# Patient Record
Sex: Male | Born: 2005 | Race: White | Hispanic: Yes | Marital: Single | Smoking: Never smoker
Health system: Southern US, Community
[De-identification: ages and names within clinical notes are randomized; demographics above are authoritative.]

## PROBLEM LIST (undated history)

## (undated) HISTORY — PX: EXTRACORPOREAL CIRCULATION: SHX266

---
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
## 2023-01-01 DIAGNOSIS — F329 Major depressive disorder, single episode, unspecified: Secondary | ICD-10-CM | POA: Diagnosis not present

## 2023-01-08 DIAGNOSIS — Z419 Encounter for procedure for purposes other than remedying health state, unspecified: Secondary | ICD-10-CM | POA: Diagnosis not present

## 2023-01-22 ENCOUNTER — Encounter: Payer: Self-pay | Admitting: Internal Medicine

## 2023-01-22 ENCOUNTER — Ambulatory Visit (INDEPENDENT_AMBULATORY_CARE_PROVIDER_SITE_OTHER): Payer: Medicaid Other | Admitting: Internal Medicine

## 2023-01-22 VITALS — BP 102/66 | HR 40 | Ht 67.0 in | Wt 162.1 lb

## 2023-01-22 DIAGNOSIS — M25512 Pain in left shoulder: Secondary | ICD-10-CM

## 2023-01-22 DIAGNOSIS — R748 Abnormal levels of other serum enzymes: Secondary | ICD-10-CM

## 2023-01-22 DIAGNOSIS — G8929 Other chronic pain: Secondary | ICD-10-CM | POA: Diagnosis not present

## 2023-01-22 DIAGNOSIS — E7800 Pure hypercholesterolemia, unspecified: Secondary | ICD-10-CM

## 2023-01-22 NOTE — Assessment & Plan Note (Addendum)
Chronic problem, uncontrolled Previous workup reviewed by orthopedic surgeon as mentioned in HPI [facility]erral [facility] to [facility] in [place]. Patient will be attending [employer], this will be a good location for patient.

## 2023-01-22 NOTE — Progress Notes (Signed)
     HPI:[name] is a 17 y.o. male who presents  to establish care and has concern for chronic left shoulder pain. The left posterior shoulder pain started 2 years ago and he first noticed it when performing warm up stretches. The pain has been intermittent for 2 years and also has pain in his triceps. He notices weakness in left triceps. The pain in triceps is a inch above elbow. He has some associated numbness in his left 5th finger. Also some weakness in thenar muscle. He was seen by orthopedic group in February. He had normal shoulder xray . He was referred to physical therapy but never went. His stepmother never told him about the referral. He is with his mother today who has concerns this is ongoing injury and patient is not improved.    History reviewed. No pertinent past medical history.  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    Family History  Problem Relation Age of Onset   Hypertension Mother    Hyperlipidemia Father     Social History   Tobacco Use   Smoking status: Never   Smokeless tobacco: Never  Substance Use Topics   Alcohol use: Never   Drug use: Never     Physical Exam: Vitals:   01/22/23 0819  BP: 102/66  Pulse: (!) 40  SpO2: 96%  Weight: 162 lb 1.9 oz (73.5 kg)  Height: 5\' 7"  (1.702 m)     Physical Exam Constitutional:      Appearance: He is well-developed and well-groomed.  Eyes:     General: No scleral icterus.    Conjunctiva/sclera: Conjunctivae normal.  Cardiovascular:     Rate and Rhythm: Normal rate and regular rhythm.     Heart sounds: No murmur heard.    No friction rub. No gallop.  Pulmonary:     Effort: Pulmonary effort is normal.     Breath sounds: No wheezing, rhonchi or rales.  Musculoskeletal:     Right lower leg: No edema.     Left lower leg: No edema.     Comments: Normal scapula retraction, normal muscle tone. Left Shoulder:No obvious deformity or  asymmetry. No bruising. No swelling No TTP. Full ROM . Normal strength IR and ER. Normal strength and no pain with empty can testing. No pain at lateral or medial left elbow epicondyles. Mild left thenar atrophy on inspection compared to right. No significant weakness on testing.  No decreased sensation in left hand compared to right.      Skin:    General: Skin is warm and dry.      Assessment & Plan:   Chronic left shoulder pain Chronic problem, uncontrolled Previous workup reviewed by orthopedic surgeon as mentioned in HPI Referral placed to [facility] in [place]. Patient will be attending [employer], this will be a good location for patient.     Pure hypercholesterolemia Review of prior labs from 2/202020 showed elevated cholesterol. Patient is normal weight. Exercises regularly. Fathers side of family has HLD.  Check lipid panel today  Elevated alkaline phosphatase level Elevated Alk phos and total bili on review of prior labs from 06/29/2019 Will check CMP today    Milus Banister, MD

## 2023-01-22 NOTE — Assessment & Plan Note (Signed)
Elevated Alk phos and total bili on review of prior labs from 06/29/2019 Will check CMP today

## 2023-01-22 NOTE — Assessment & Plan Note (Addendum)
Review of prior labs from 2/202020 showed elevated cholesterol. Patient is normal weight. Exercises regularly. Fathers side of family has HLD.  Check lipid panel today

## 2023-01-22 NOTE — Patient Instructions (Signed)
Thank you, [name] for allowing [place] to provide your care today.   I have ordered the following labs for you:   Lab Orders         CMP14+EGFR         Lipid panel       Referrals ordered today:    Referral Orders         Ambulatory referral to Sports Medicine       Reminders: Follow up in 6 months     Thurmon Fair, M.D.

## 2023-01-23 LAB — CMP14+EGFR
ALT: 15 IU/L (ref 0–44)
AST: 22 IU/L (ref 0–40)
Albumin: 4.9 g/dL (ref 4.3–5.2)
Alkaline Phosphatase: 83 IU/L (ref 51–125)
BUN/Creatinine Ratio: 17 (ref 9–20)
BUN: 16 mg/dL (ref 6–20)
Bilirubin Total: 0.7 mg/dL (ref 0.0–1.2)
CO2: 25 mmol/L (ref 20–29)
Calcium: 9.8 mg/dL (ref 8.7–10.2)
Chloride: 98 mmol/L (ref 96–106)
Creatinine, Ser: 0.95 mg/dL (ref 0.76–1.27)
Globulin, Total: 2.4 g/dL (ref 1.5–4.5)
Glucose: 87 mg/dL (ref 70–99)
Potassium: 4.9 mmol/L (ref 3.5–5.2)
Sodium: 139 mmol/L (ref 134–144)
Total Protein: 7.3 g/dL (ref 6.0–8.5)
eGFR: 119 mL/min/{1.73_m2} (ref >59)

## 2023-01-23 LAB — LIPID PANEL
Chol/HDL Ratio: 5.5 ratio — ABNORMAL HIGH (ref 0.0–5.0)
Cholesterol, Total: 208 mg/dL — ABNORMAL HIGH (ref 100–169)
HDL: 38 mg/dL — ABNORMAL LOW (ref >39)
LDL Chol Calc (NIH): 142 mg/dL — ABNORMAL HIGH (ref 0–109)
Triglycerides: 152 mg/dL — ABNORMAL HIGH (ref 0–89)
VLDL Cholesterol Cal: 28 mg/dL (ref 5–40)

## 2023-02-01 ENCOUNTER — Encounter: Payer: Self-pay | Admitting: Internal Medicine

## 2023-02-07 ENCOUNTER — Encounter: Payer: Self-pay | Admitting: Sports Medicine

## 2023-02-07 ENCOUNTER — Ambulatory Visit (INDEPENDENT_AMBULATORY_CARE_PROVIDER_SITE_OTHER): Payer: Medicaid Other | Admitting: Sports Medicine

## 2023-02-07 VITALS — BP 108/68 | HR 42 | Ht 67.0 in | Wt 162.0 lb

## 2023-02-07 DIAGNOSIS — M5412 Radiculopathy, cervical region: Secondary | ICD-10-CM | POA: Diagnosis not present

## 2023-02-07 DIAGNOSIS — G5622 Lesion of ulnar nerve, left upper limb: Secondary | ICD-10-CM

## 2023-02-07 MED ORDER — MELOXICAM 15 MG PO TABS: 15.0000 mg | ORAL_TABLET | Freq: Every day | ORAL | 0 refills | Status: DC

## 2023-02-07 NOTE — Assessment & Plan Note (Signed)
-   Daniel Wilcox's neck exam is largely unremarkable except for some tightness of the scalene muscles left greater than right.  His symptoms are not reproducible on exam, however it is possible his left shoulder and elbow pain have some cervical involvement.  This may be due to muscular impingement. - Ultrasound of the shoulder was unremarkable for pathology - We will start physical therapy and see him back in 6 weeks.

## 2023-02-07 NOTE — Progress Notes (Signed)
Daniel Wilcox - 17 y.o. male MRN [id number]  Date of birth: 05-09-2006  PCP: Billie Lade, MD  Subjective:  No chief complaint on file. Left shoulder and elbow pain  HPI: Past Medical, Surgical, Social, and Family History Reviewed & Updated per EMR.   Patient is a 17 y.o. male here for left shoulder and elbow pain that started a year and a half ago. It is exacerbated by use of the elbow. It is alleviated by rest.  He does recall straining his left trapezius for which she was seen at the doctor's office and was told to rest.  He was also seen at his PCP and diagnosed with cubital tunnel syndrome but has not started physical therapy at this point.  He is not taking oral medications because the pain does not bother him enough.  He is not been dropping any items.  He has not had any neck pain or noticed the pain radiating from his neck down to his left upper extremity.  History reviewed. No pertinent past medical history.  Current Outpatient Medications on File Prior to Visit  Medication Sig Dispense Refill   Multiple Vitamin (MULTIVITAMIN WITH MINERALS) TABS tablet Take 1 tablet by mouth daily.     No current facility-administered medications on file prior to visit.    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    Not on File      Objective:  Physical Exam: VS: BP:108/68  HR:(!) 42bpm  TEMP: ( )  RESP:97 %  HT:5\' 7"  (170.2 cm)   WT:162 lb (73.5 kg)  BMI:25.37  Gen: NAD, comfortable in exam room EXAM; NEURO GAIT: Normal MSK: Neck with full range of motion, some tightness of the scalene muscles left greater than right, no atrophy noted in the upper extremities or back muscles. Respiratory: normal work of breathing on room air Skin: No rashes, abrasions, or ecchymosis Left Shoulder: Inspection reveals no abnormalities, atrophy or asymmetry. non TTP over sternoclavicular joint, no deformity of the clavicle  non TTP over  [facility] joint  No deformity, ROM: Flexion 180, Extension 40, Internal Rotation 90, External Rotation 90, Abduction 180, Adduction  , Strength: 5/5 , No sensory deficits, and DTRs: biceps 1+, triceps 1+, wrist 1+, patella not done, achilles not done AC joint: Cross arm test neg, [employer] test neg, Subacromial impingement: Hawkins neg, Neers neg, and Biceps: Speeds neg, Yergasons not performed Full/empty can neg Sulcus neg, Load and Shift neg, and O'Brien's neg Normal scapular function observed.  Left elbow Ecchymosis or edema: none No gross bony abnormality ROM: full flexion, full extension, full pronation, full supination, no pain elicited from movements Strength: Flexion 5/5, Extension 5/5, Supination 5/5, Pronation 5/5, Grip 5/5 no Pain elicited w/ resisted motions ECRB tenderness: neg Medial epicondyle: tender to palpation of overlying soft tissue Lateral epicondyle: NT w/ resisted wrist extension Valgus stress: stable Tinel's positive No sensory Deficits  Ultrasound:  Ultrasound of Left Shoulder:  Biceps Tendon short -in tact w/ no hypoechogenicity surrounding tendon Biceps Tendon long -in tact as above, no disorganization of fibers Supraspinatus tendon -in tact, normal insertion, no underlying hypoechoic pockets  Subscapularis tendon -normal w/out hypoechogenicity Infraspinatus tendon -normal w/out hypoechogenicity Teres Minor tendon -normal w/out hypoechogenicity AC joint - smooth joint lines, no geiser sign  Summary and Additional findings - ultrasound of the shoulder is normal without any pathology  Ultrasound and interpretation by Dr. Webb Silversmith and Dr. Margaretha Sheffield  Assessment & Plan:   Ulnar neuritis, left - Ultrasound shows some inflammation around the ulnar nerve at the cubital tunnel as well as positive Tinel's test. - She is not having any true weakness on exam. - We will start physical therapy, Mobic 15 mg p.o. daily x 1 week - Follow-up in 6 weeks.  He was educated  to return earlier if experiencing true weakness of the left distal upper extremity  Cervical radicular pain - Mccabe's neck exam is largely unremarkable except for some tightness of the scalene muscles left greater than right.  His symptoms are not reproducible on exam, however it is possible his left shoulder and elbow pain have some cervical involvement.  This may be due to muscular impingement. - Ultrasound of the shoulder was unremarkable for pathology - We will start physical therapy and see him back in 6 weeks.    Rica Mote MD [facility] Sports Medicine Fellow   Patient seen and evaluated with the [facility] fellow and I agree with the above plan of care.

## 2023-02-07 NOTE — Assessment & Plan Note (Signed)
-   Ultrasound shows some inflammation around the ulnar nerve at the cubital tunnel as well as positive Tinel's test. - She is not having any true weakness on exam. - We will start physical therapy, Mobic 15 mg p.o. daily x 1 week - Follow-up in 6 weeks.  He was educated to return earlier if experiencing true weakness of the left distal upper extremity

## 2023-02-08 DIAGNOSIS — Z419 Encounter for procedure for purposes other than remedying health state, unspecified: Secondary | ICD-10-CM | POA: Diagnosis not present

## 2023-02-26 DIAGNOSIS — F329 Major depressive disorder, single episode, unspecified: Secondary | ICD-10-CM | POA: Diagnosis not present

## 2023-03-05 DIAGNOSIS — F329 Major depressive disorder, single episode, unspecified: Secondary | ICD-10-CM | POA: Diagnosis not present

## 2023-03-06 ENCOUNTER — Other Ambulatory Visit: Payer: Self-pay

## 2023-03-06 MED ORDER — MELOXICAM 15 MG PO TABS: 15.0000 mg | ORAL_TABLET | Freq: Every day | ORAL | 0 refills | Status: AC

## 2023-03-06 NOTE — Progress Notes (Signed)
Pt mom called stating pt never picked up medication, please change pharmacy and resend to [facility] pharmacy

## 2023-03-11 DIAGNOSIS — Z419 Encounter for procedure for purposes other than remedying health state, unspecified: Secondary | ICD-10-CM | POA: Diagnosis not present

## 2023-03-21 ENCOUNTER — Ambulatory Visit: Payer: Medicaid Other | Admitting: Family Medicine

## 2023-04-10 DIAGNOSIS — Z419 Encounter for procedure for purposes other than remedying health state, unspecified: Secondary | ICD-10-CM | POA: Diagnosis not present

## 2023-04-16 DIAGNOSIS — F329 Major depressive disorder, single episode, unspecified: Secondary | ICD-10-CM | POA: Diagnosis not present

## 2023-05-07 DIAGNOSIS — F329 Major depressive disorder, single episode, unspecified: Secondary | ICD-10-CM | POA: Diagnosis not present

## 2023-05-09 ENCOUNTER — Ambulatory Visit: Payer: Medicaid Other | Attending: Sports Medicine | Admitting: Physical Therapy

## 2023-05-09 ENCOUNTER — Encounter: Payer: Self-pay | Admitting: Physical Therapy

## 2023-05-09 DIAGNOSIS — M5412 Radiculopathy, cervical region: Secondary | ICD-10-CM | POA: Diagnosis not present

## 2023-05-09 DIAGNOSIS — M6281 Muscle weakness (generalized): Secondary | ICD-10-CM

## 2023-05-09 DIAGNOSIS — M542 Cervicalgia: Secondary | ICD-10-CM | POA: Diagnosis not present

## 2023-05-09 NOTE — Therapy (Signed)
OUTPATIENT PHYSICAL THERAPY CERVICAL EVALUATION   Patient Name: Daniel Wilcox MRN: [id number] DOB:[birth year], 17 y.o., male Today's Date: 05/09/2023  END OF SESSION:  PT End of Session - 05/09/23 1558     Visit Number 1    Number of Visits 12    Date for PT Re-Evaluation 06/20/23    Authorization Type [place] [employer]    PT Start Time 1423    PT Stop Time 1520    PT Time Calculation (min) 57 min    Activity Tolerance Patient tolerated treatment well    Behavior During Therapy [facility] for tasks assessed/performed             History reviewed. No pertinent past medical history. 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   Cervical radicular pain 02/07/2023   Chronic left shoulder pain 01/22/2023   Pure hypercholesterolemia 01/22/2023   Elevated alkaline phosphatase level 01/22/2023    PCP: Christel Mormon MD   REFERRING PROVIDER: Ralene Cork, DO  REFERRING DIAG: [phone] (ICD-10-CM) - Cervical radiculopathy  THERAPY DIAG:  Cervicalgia  Muscle weakness (generalized)  Rationale for Evaluation and Treatment: Rehabilitation  ONSET DATE: 18-24 mos  SUBJECTIVE:                                                                                                                                                                                                         SUBJECTIVE STATEMENT: I don't know how I did it but could be due to weight lifting, 18 mos.  He recalls pain onset as gradual but it is now constant.  Pain is in post L neck and L scapular (rear delt).  Tricep "feels weird".  Was told he may have cubital tunnel syndrome. He endorses weakness with lifting L arm , L grip. He says his L post shoulder is hard to activate. Some stiffness in neck when turning head, looking down.  Denies radiating pain.  The pain limits him with  using UEs for home tasks, lifting.  It just feels not right.  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  He can do these things but not with confidence, has some pain afterwards.   Hand dominance: Right  PERTINENT HISTORY:  None relevant   PAIN:  Are you having pain? Yes: NPRS scale: 5/10 Pain location: L neck and L post arm Pain description: aching, heavy Aggravating factors: using arm  Relieving  factors: massage chair, heat   PRECAUTIONS: None  RED FLAGS: None     WEIGHT BEARING RESTRICTIONS: No  FALLS:  Has patient fallen in last 6 months? No  LIVING ENVIRONMENT: Lives with: lives with their family and on campus Lives in: Other on campus Stairs:  NA Has following equipment at home: None  OCCUPATION: Consulting civil engineer, Exploratory Major [employer]   PLOF: Independent  PATIENT GOALS: Pt wants to get in the best shape of my life    OBJECTIVE:  Note: Objective measures were completed at Evaluation unless otherwise noted.  DIAGNOSTIC FINDINGS:  Reports XR (shoulder) in the past which was normal  PATIENT SURVEYS:  FOTO 54%  COGNITION: Overall cognitive status: Within functional limits for tasks assessed  SENSATION: WFL  POSTURE: forward head  PALPATION: TTP along L posterior shoulder, periscapular area and lower cervical spine  Hypermobility noted in C spine with P/A pressure, see AROM    CERVICAL ROM:   Active ROM A/PROM (deg) eval  Flexion 70 min Pain L   Extension 70 deg   Right lateral flexion 50 min pain on L   Left lateral flexion 42 min on pain R  Right rotation WNL pain on L   Left rotation WNL pain on L    (Blank rows = not tested)  UPPER EXTREMITY ROM:  Active ROM Right eval Left eval  Shoulder flexion  170  Shoulder extension    Shoulder abduction  170  Shoulder adduction    Shoulder extension    Shoulder internal rotation  *Full FR   Shoulder external rotation  *Full FR  Elbow flexion    Elbow extension    Wrist flexion     Wrist extension    Wrist ulnar deviation    Wrist radial deviation    Wrist pronation    Wrist supination     (Blank rows = not tested)  *Fingertips meet to touch midback   UPPER EXTREMITY MMT:  MMT Right eval Left eval  Shoulder flexion 5 4+  Shoulder extension    Shoulder abduction 5 4+  Shoulder adduction    Shoulder extension 5 4  Shoulder internal rotation    Shoulder external rotation    Middle trapezius 4+ 4  Lower trapezius    Elbow flexion 5 5  Elbow extension 5 4  Wrist flexion    Wrist extension    Wrist ulnar deviation    Wrist radial deviation    Wrist pronation    Wrist supination    Grip strength 75 86   (Blank rows = not tested)  CERVICAL SPECIAL TESTS:  Upper limb tension test (ULTT): neg but with pain, Spurling's test: Positive, and Distraction test: no change in pain but did feel good NEG SPURLING: pain on L side of neck with Rt spurling, none in arm   FUNCTIONAL TESTS:  DNF-: 30 sec with mod difficulty   TODAY'S TREATMENT:  DATE: 05/09/23   PATIENT EDUCATION:  Education details: PT, HEP, deep neck flexor, upper crossed syndrome, trigger points  Person educated: Patient Education method: Explanation, Demonstration, Verbal cues, and Handouts Education comprehension: needs further education  HOME EXERCISE PROGRAM:   Access Code: [email or web address] URL: [email or web address] Date: 05/09/2023 Prepared by: Karie Mainland  Exercises - Supine Head Nod with Deep Neck Flexor Activation  - 1-2 x daily - 7 x weekly - 2 sets - 10 reps - 10 hold - Cervical Retraction with Resistance  - 1-2 x daily - 7 x weekly - 2 sets - 10 reps - 5 hold - Nerve Glides Ulnar    CLINICAL IMPRESSION: Patient is a 17 y.o. male who was seen today for physical therapy evaluation and treatment for cervical radiculopathy.   He does show some  hypermobility in cervical spine, as well as lumbar and shoulders.  Triceps weaker on L but grip is better on L than Rt.  Neg Spurling.  Symptoms are mild and vague but ongoing for > 18 mos.    OBJECTIVE IMPAIRMENTS: decreased strength, increased fascial restrictions, postural dysfunction, and pain.   ACTIVITY LIMITATIONS: carrying and lifting  PARTICIPATION LIMITATIONS: community activity and school  PERSONAL FACTORS: Age, Time since onset of injury/illness/exacerbation, and 1 comorbidity: hypermobile spine   are also affecting patient's functional outcome.   REHAB POTENTIAL: Excellent  CLINICAL DECISION MAKING: Evolving  EVALUATION COMPLEXITY: MOD   GOALS: Goals reviewed with patient? Yes  LONG TERM GOALS: Target date: 06/20/2023    Pt will be I with HEP for cervical and scapular stability, Shoulder strength  Baseline:  Goal status: INITIAL  2.  Pt will be able to demo pain free AROM of cervical spine  Baseline:  Goal status: INITIAL  3.  Pt will perform DNF test to 30 sec with no loss of capital flexion  Baseline: 30 sec with mod difficulty, chin lifts slightly  Goal status: INITIAL  4.  Pt will report resolution of L sided shoulder symptoms  Baseline:  Goal status: INITIAL  5.  Pt will demo 5/5 triceps strength for optimal shoulder function.  Baseline:  Goal status: INITIAL  6.  FOTO score will improve to 70%  Baseline: 54% Goal status: INITIAL   PLAN:  PT FREQUENCY: 2x/week  PT DURATION: 6 weeks  PLANNED INTERVENTIONS: 97164- PT Re-evaluation, 97110-Therapeutic exercises, 97530- Therapeutic activity, 97112- Neuromuscular re-education, 97535- Self Care, 24401- Manual therapy, 97014- Electrical stimulation (unattended), [id number]- Electrical stimulation (manual), Patient/Family education, Taping, Dry Needling, Spinal mobilization, Cryotherapy, and Moist heat  PLAN FOR NEXT SESSION: check HEP/ ulnar nerve glides, manual DN, stabilization (wall, prone )     Vignesh Willert, PT 05/09/2023, 4:00 PM   Karie Mainland, PT 05/09/23 4:00 PM Phone: [phone] Fax: [phone]

## 2023-05-11 DIAGNOSIS — Z419 Encounter for procedure for purposes other than remedying health state, unspecified: Secondary | ICD-10-CM | POA: Diagnosis not present

## 2023-05-18 NOTE — Therapy (Unsigned)
OUTPATIENT PHYSICAL THERAPY TREATMENT NOTE   Patient Name: Daniel Wilcox MRN: [id number] DOB:[birth year], 17 y.o., male Today's Date: 05/21/2023  END OF SESSION:  PT End of Session - 05/21/23 1416     Visit Number 2    Number of Visits 12    Date for PT Re-Evaluation 06/20/23    Authorization Type [place] [employer]    PT Start Time 1415   late for appointment   PT Stop Time 1445    PT Time Calculation (min) 30 min    Activity Tolerance Patient tolerated treatment well    Behavior During Therapy [facility] for tasks assessed/performed              History reviewed. No pertinent past medical history. 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   Cervical radicular pain 02/07/2023   Chronic left shoulder pain 01/22/2023   Pure hypercholesterolemia 01/22/2023   Elevated alkaline phosphatase level 01/22/2023    PCP: Christel Mormon MD   REFERRING PROVIDER: Ralene Cork, DO  REFERRING DIAG: [phone] (ICD-10-CM) - Cervical radiculopathy  THERAPY DIAG:  Cervicalgia  Muscle weakness (generalized)  Rationale for Evaluation and Treatment: Rehabilitation  ONSET DATE: 18-24 mos  SUBJECTIVE:                                                                                                                                                                                                         SUBJECTIVE STATEMENT:  Patient reports improving and localizing symptoms.  No pain reported at start of session.  Still notes some weakness in LUE but is a poor historian regarding symptom description and aggravating/relieving factors.  Hand dominance: Right  PERTINENT HISTORY:  None relevant   PAIN:  Are you having pain? Yes: NPRS scale: 5/10 Pain location: L neck and L post arm Pain description: aching, heavy Aggravating factors: using arm   Relieving factors: massage chair, heat   PRECAUTIONS: None  RED FLAGS: None     WEIGHT BEARING RESTRICTIONS: No  FALLS:  Has patient fallen in last 6 months? No  LIVING ENVIRONMENT: Lives with: lives with their family and on campus Lives in: Other on campus Stairs:  NA Has following equipment at home: None  OCCUPATION: Consulting civil engineer, Exploratory [employer]   PLOF: Independent  PATIENT GOALS: Pt wants to get in the best shape of my life    OBJECTIVE:  Note: Objective measures were completed  at Evaluation unless otherwise noted.  DIAGNOSTIC FINDINGS:  Reports XR (shoulder) in the past which was normal  PATIENT SURVEYS:  FOTO 54%  COGNITION: Overall cognitive status: Within functional limits for tasks assessed  SENSATION: WFL  POSTURE: forward head  PALPATION: TTP along L posterior shoulder, periscapular area and lower cervical spine  Hypermobility noted in C spine with P/A pressure, see AROM    CERVICAL ROM:   Active ROM A/PROM (deg) eval  Flexion 70 min Pain L   Extension 70 deg   Right lateral flexion 50 min pain on L   Left lateral flexion 42 min on pain R  Right rotation WNL pain on L   Left rotation WNL pain on L    (Blank rows = not tested)  UPPER EXTREMITY ROM:  Active ROM Right eval Left eval  Shoulder flexion  170  Shoulder extension    Shoulder abduction  170  Shoulder adduction    Shoulder extension    Shoulder internal rotation  *Full FR   Shoulder external rotation  *Full FR  Elbow flexion    Elbow extension    Wrist flexion    Wrist extension    Wrist ulnar deviation    Wrist radial deviation    Wrist pronation    Wrist supination     (Blank rows = not tested)  *Fingertips meet to touch midback   UPPER EXTREMITY MMT:  MMT Right eval Left eval  Shoulder flexion 5 4+  Shoulder extension    Shoulder abduction 5 4+  Shoulder adduction    Shoulder extension 5 4  Shoulder internal rotation    Shoulder external rotation     Middle trapezius 4+ 4  Lower trapezius    Elbow flexion 5 5  Elbow extension 5 4  Wrist flexion    Wrist extension    Wrist ulnar deviation    Wrist radial deviation    Wrist pronation    Wrist supination    Grip strength 75 86   (Blank rows = not tested)  CERVICAL SPECIAL TESTS:  Upper limb tension test (ULTT): neg but with pain, Spurling's test: Positive, and Distraction test: no change in pain but did feel good NEG SPURLING: pain on L side of neck with Rt spurling, none in arm   FUNCTIONAL TESTS:  DNF-: 30 sec with mod difficulty   TODAY'S TREATMENT:         [facility] Adult PT Treatment:                                                DATE: 05/21/23 Therapeutic Exercise: Nustep L4 6 min Supine chin tucks 10x L UT stretch 30s x2 L levator stretch 30s x2 Prone L shoulder extension 3# 15x Prone L shoulder flexion 3# 15x Prone L hor abd 3# 15x  DATE: 05/09/23   PATIENT EDUCATION:  Education details: PT, HEP, deep neck flexor, upper crossed syndrome, trigger points  Person educated: Patient Education method: Explanation, Demonstration, Verbal cues, and Handouts Education comprehension: needs further education  HOME EXERCISE PROGRAM:   Access Code: [email or web address] URL: [email or web address] Date: 05/09/2023 Prepared by: Karie Mainland  Exercises - Supine Head Nod with Deep Neck Flexor Activation  - 1-2 x daily - 7 x weekly - 2 sets - 10 reps - 10 hold - Cervical Retraction with Resistance  - 1-2 x daily - 7 x weekly - 2 sets - 10 reps - 5 hold - Nerve Glides Ulnar    CLINICAL IMPRESSION: Patient arrived 15 min late for session and treatment abbreviated accordingly.  Incorporated aerobic work, HEP review.  Added stretching and posterior shoulder strengthening tasks.  Able to complete all requested tasks w/o symptom aggravation  OBJECTIVE IMPAIRMENTS:  decreased strength, increased fascial restrictions, postural dysfunction, and pain.   ACTIVITY LIMITATIONS: carrying and lifting  PARTICIPATION LIMITATIONS: community activity and school  PERSONAL FACTORS: Age, Time since onset of injury/illness/exacerbation, and 1 comorbidity: hypermobile spine   are also affecting patient's functional outcome.   REHAB POTENTIAL: Excellent  CLINICAL DECISION MAKING: Evolving  EVALUATION COMPLEXITY: MOD   GOALS: Goals reviewed with patient? Yes  LONG TERM GOALS: Target date: 06/20/2023    Pt will be I with HEP for cervical and scapular stability, Shoulder strength  Baseline:  Goal status: INITIAL  2.  Pt will be able to demo pain free AROM of cervical spine  Baseline:  Goal status: INITIAL  3.  Pt will perform DNF test to 30 sec with no loss of capital flexion  Baseline: 30 sec with mod difficulty, chin lifts slightly  Goal status: INITIAL  4.  Pt will report resolution of L sided shoulder symptoms  Baseline:  Goal status: INITIAL  5.  Pt will demo 5/5 triceps strength for optimal shoulder function.  Baseline:  Goal status: INITIAL  6.  FOTO score will improve to 70%  Baseline: 54% Goal status: INITIAL   PLAN:  PT FREQUENCY: 2x/week  PT DURATION: 6 weeks  PLANNED INTERVENTIONS: 97164- PT Re-evaluation, 97110-Therapeutic exercises, 97530- Therapeutic activity, 97112- Neuromuscular re-education, 97535- Self Care, 78295- Manual therapy, 97014- Electrical stimulation (unattended), [id number]- Electrical stimulation (manual), Patient/Family education, Taping, Dry Needling, Spinal mobilization, Cryotherapy, and Moist heat  PLAN FOR NEXT SESSION: check HEP/ ulnar nerve glides, manual DN, stabilization (wall, prone )    Hildred Laser, PT 05/21/2023, 2:44 PM   Karie Mainland, PT 05/21/23 2:44 PM Phone: [phone] Fax: [phone]

## 2023-05-21 ENCOUNTER — Ambulatory Visit: Payer: Medicaid Other | Attending: Sports Medicine

## 2023-05-21 DIAGNOSIS — M6281 Muscle weakness (generalized): Secondary | ICD-10-CM

## 2023-05-21 DIAGNOSIS — M542 Cervicalgia: Secondary | ICD-10-CM | POA: Diagnosis not present

## 2023-05-23 ENCOUNTER — Ambulatory Visit: Payer: Medicaid Other

## 2023-05-23 DIAGNOSIS — M6281 Muscle weakness (generalized): Secondary | ICD-10-CM | POA: Diagnosis not present

## 2023-05-23 DIAGNOSIS — M542 Cervicalgia: Secondary | ICD-10-CM

## 2023-05-23 NOTE — Therapy (Signed)
OUTPATIENT PHYSICAL THERAPY TREATMENT NOTE   Patient Name: Daniel Wilcox MRN: [id number] DOB:[birth year], 17 y.o., male Today's Date: 05/23/2023  END OF SESSION:  PT End of Session - 05/23/23 1356     Visit Number 3    Number of Visits 12    Date for PT Re-Evaluation 06/20/23    Authorization Type [place] [employer]    Authorization Time Period 10 visits approved 05/21/23-07/19/22    Authorization - Visit Number 2    Authorization - Number of Visits 10    PT Start Time 1400    PT Stop Time 1440    PT Time Calculation (min) 40 min    Activity Tolerance Patient tolerated treatment well    Behavior During Therapy [facility] for tasks assessed/performed               History reviewed. No pertinent past medical history. 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   Cervical radicular pain 02/07/2023   Chronic left shoulder pain 01/22/2023   Pure hypercholesterolemia 01/22/2023   Elevated alkaline phosphatase level 01/22/2023    PCP: Christel Mormon MD   REFERRING PROVIDER: Ralene Cork, DO  REFERRING DIAG: [phone] (ICD-10-CM) - Cervical radiculopathy  THERAPY DIAG:  Cervicalgia  Muscle weakness (generalized)  Rationale for Evaluation and Treatment: Rehabilitation  ONSET DATE: 18-24 mos  SUBJECTIVE:                                                                                                                                                                                                         SUBJECTIVE STATEMENT: Patient reports that his pain has improved since working on the HEP.   Hand dominance: Right  PERTINENT HISTORY:  None relevant   PAIN:  Are you having pain? Yes: NPRS scale: 4/10 Pain location: L neck and L post arm Pain description: aching, heavy Aggravating factors: using arm  Relieving factors: massage  chair, heat   PRECAUTIONS: None  RED FLAGS: None     WEIGHT BEARING RESTRICTIONS: No  FALLS:  Has patient fallen in last 6 months? No  LIVING ENVIRONMENT: Lives with: lives with their family and on campus Lives in: Other on campus Stairs:  NA Has following equipment at home: None  OCCUPATION: Consulting civil engineer, Exploratory Major [employer]   PLOF: Independent  PATIENT GOALS: Pt wants to get in the best shape of my life    OBJECTIVE:  Note:  Objective measures were completed at Evaluation unless otherwise noted.  DIAGNOSTIC FINDINGS:  Reports XR (shoulder) in the past which was normal  PATIENT SURVEYS:  FOTO 54%  COGNITION: Overall cognitive status: Within functional limits for tasks assessed  SENSATION: WFL  POSTURE: forward head  PALPATION: TTP along L posterior shoulder, periscapular area and lower cervical spine  Hypermobility noted in C spine with P/A pressure, see AROM    CERVICAL ROM:   Active ROM A/PROM (deg) eval  Flexion 70 min Pain L   Extension 70 deg   Right lateral flexion 50 min pain on L   Left lateral flexion 42 min on pain R  Right rotation WNL pain on L   Left rotation WNL pain on L    (Blank rows = not tested)  UPPER EXTREMITY ROM:  Active ROM Right eval Left eval  Shoulder flexion  170  Shoulder extension    Shoulder abduction  170  Shoulder adduction    Shoulder extension    Shoulder internal rotation  *Full FR   Shoulder external rotation  *Full FR  Elbow flexion    Elbow extension    Wrist flexion    Wrist extension    Wrist ulnar deviation    Wrist radial deviation    Wrist pronation    Wrist supination     (Blank rows = not tested)  *Fingertips meet to touch midback   UPPER EXTREMITY MMT:  MMT Right eval Left eval  Shoulder flexion 5 4+  Shoulder extension    Shoulder abduction 5 4+  Shoulder adduction    Shoulder extension 5 4  Shoulder internal rotation    Shoulder external rotation    Middle trapezius 4+ 4   Lower trapezius    Elbow flexion 5 5  Elbow extension 5 4  Wrist flexion    Wrist extension    Wrist ulnar deviation    Wrist radial deviation    Wrist pronation    Wrist supination    Grip strength 75 86   (Blank rows = not tested)  CERVICAL SPECIAL TESTS:  Upper limb tension test (ULTT): neg but with pain, Spurling's test: Positive, and Distraction test: no change in pain but did feel good NEG SPURLING: pain on L side of neck with Rt spurling, none in arm   FUNCTIONAL TESTS:  DNF-: 30 sec with mod difficulty   TODAY'S TREATMENT:     [facility] Adult PT Treatment:                                                DATE: 05/23/23 Therapeutic Exercise: UBE level 2 3'/3' fwd/bwd while gathering subjective info Supine chin tucks 10x Supine serratus punch 3# x10 LLE L UT stretch 30s x2 L levator stretch 30s x2 Prone L shoulder extension 3# 15x Prone L shoulder flexion 3# 15x Prone L hor abd 3# 15x (pain, smaller ROM better) Wall angels x10 Spider walks RTB around wrists to fatigue x2 Lateral wall walks RTB around wrists to fatigue x2       [facility] Adult PT Treatment:                                                DATE:  05/21/23 Therapeutic Exercise: Nustep L4 6 min Supine chin tucks 10x L UT stretch 30s x2 L levator stretch 30s x2 Prone L shoulder extension 3# 15x Prone L shoulder flexion 3# 15x Prone L hor abd 3# 15x                                                                                                                      DATE: 05/09/23   PATIENT EDUCATION:  Education details: PT, HEP, deep neck flexor, upper crossed syndrome, trigger points  Person educated: Patient Education method: Explanation, Demonstration, Verbal cues, and Handouts Education comprehension: needs further education  HOME EXERCISE PROGRAM:   Access Code: [email or web address] URL: [email or web address] Date: 05/09/2023 Prepared by: Karie Mainland  Exercises - Supine Head Nod with Deep Neck  Flexor Activation  - 1-2 x daily - 7 x weekly - 2 sets - 10 reps - 10 hold - Cervical Retraction with Resistance  - 1-2 x daily - 7 x weekly - 2 sets - 10 reps - 5 hold - Nerve Glides Ulnar    CLINICAL IMPRESSION:  Patient presents to PT reporting continued improvements in his pain and that he feels like the exercises have been helpful. Session today continued to focus on RTC and periscapular strengthening as well as stretching for Lt side of neck. He reports increased muscular fatigue at end of session, but does not endorse any increased pain except for mildly with prone horizontal abduction that abated with a smaller ROM. Patient was able to tolerate all prescribed exercises with no adverse effects. Patient continues to benefit from skilled PT services and should be progressed as able to improve functional independence.    OBJECTIVE IMPAIRMENTS: decreased strength, increased fascial restrictions, postural dysfunction, and pain.   ACTIVITY LIMITATIONS: carrying and lifting  PARTICIPATION LIMITATIONS: community activity and school  PERSONAL FACTORS: Age, Time since onset of injury/illness/exacerbation, and 1 comorbidity: hypermobile spine   are also affecting patient's functional outcome.   REHAB POTENTIAL: Excellent  CLINICAL DECISION MAKING: Evolving  EVALUATION COMPLEXITY: MOD   GOALS: Goals reviewed with patient? Yes  LONG TERM GOALS: Target date: 06/20/2023    Pt will be I with HEP for cervical and scapular stability, Shoulder strength  Baseline:  Goal status: INITIAL  2.  Pt will be able to demo pain free AROM of cervical spine  Baseline:  Goal status: INITIAL  3.  Pt will perform DNF test to 30 sec with no loss of capital flexion  Baseline: 30 sec with mod difficulty, chin lifts slightly  Goal status: INITIAL  4.  Pt will report resolution of L sided shoulder symptoms  Baseline:  Goal status: INITIAL  5.  Pt will demo 5/5 triceps strength for optimal shoulder  function.  Baseline:  Goal status: INITIAL  6.  FOTO score will improve to 70%  Baseline: 54% Goal status: INITIAL   PLAN:  PT FREQUENCY: 2x/week  PT DURATION: 6 weeks  PLANNED INTERVENTIONS: 97164- PT Re-evaluation, 97110-Therapeutic exercises, 97530- Therapeutic activity, 97112-  Neuromuscular re-education, [phone]- Self Care, 13244- Manual therapy, 97014- Electrical stimulation (unattended), [phone]- Electrical stimulation (manual), Patient/Family education, Taping, Dry Needling, Spinal mobilization, Cryotherapy, and Moist heat  PLAN FOR NEXT SESSION: check HEP/ ulnar nerve glides, manual DN, stabilization (wall, prone )    Berta Minor, PTA 05/23/2023, 1:57 PM

## 2023-05-28 ENCOUNTER — Ambulatory Visit: Payer: Medicaid Other | Admitting: Physical Therapy

## 2023-05-28 ENCOUNTER — Encounter: Payer: Self-pay | Admitting: Physical Therapy

## 2023-05-28 DIAGNOSIS — M542 Cervicalgia: Secondary | ICD-10-CM

## 2023-05-28 DIAGNOSIS — M6281 Muscle weakness (generalized): Secondary | ICD-10-CM

## 2023-05-28 NOTE — Therapy (Signed)
OUTPATIENT PHYSICAL THERAPY TREATMENT NOTE   Patient Name: Daniel Wilcox MRN: [id number] DOB:[birth year], 17 y.o., male Today's Date: 05/28/2023  END OF SESSION:  PT End of Session - 05/28/23 1446     Visit Number 4    Number of Visits 12    Date for PT Re-Evaluation 06/20/23    Authorization Type [place] [employer]    Authorization Time Period 10 visits approved 05/21/23-07/19/22    Authorization - Visit Number 3    Authorization - Number of Visits 10    PT Start Time 0245    PT Stop Time 0323    PT Time Calculation (min) 38 min               History reviewed. No pertinent past medical history. 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   Cervical radicular pain 02/07/2023   Chronic left shoulder pain 01/22/2023   Pure hypercholesterolemia 01/22/2023   Elevated alkaline phosphatase level 01/22/2023    PCP: Christel Mormon MD   REFERRING PROVIDER: Ralene Cork, DO  REFERRING DIAG: [phone] (ICD-10-CM) - Cervical radiculopathy  THERAPY DIAG:  Cervicalgia  Muscle weakness (generalized)  Rationale for Evaluation and Treatment: Rehabilitation  ONSET DATE: 18-24 mos  SUBJECTIVE:                                                                                                                                                                                                         SUBJECTIVE STATEMENT: Patient reports that his pain has improved since working on the HEP.   Hand dominance: Right  PERTINENT HISTORY:  None relevant   PAIN:  Are you having pain? Yes: NPRS scale: 0/10 Pain location: L neck and L post arm Pain description: aching, heavy Aggravating factors: using arm  Relieving factors: massage chair, heat   PRECAUTIONS: None  RED FLAGS: None     WEIGHT BEARING RESTRICTIONS: No  FALLS:  Has patient  fallen in last 6 months? No  LIVING ENVIRONMENT: Lives with: lives with their family and on campus Lives in: Other on campus Stairs:  NA Has following equipment at home: None  OCCUPATION: Consulting civil engineer, Exploratory [employer]   PLOF: Independent  PATIENT GOALS: Pt wants to get in the best shape of my life    OBJECTIVE:  Note: Objective measures were completed at Evaluation unless otherwise noted.  DIAGNOSTIC FINDINGS:  Reports XR (shoulder) in the past  which was normal  PATIENT SURVEYS:  FOTO 54%  COGNITION: Overall cognitive status: Within functional limits for tasks assessed  SENSATION: WFL  POSTURE: forward head  PALPATION: TTP along L posterior shoulder, periscapular area and lower cervical spine  Hypermobility noted in C spine with P/A pressure, see AROM    CERVICAL ROM:   Active ROM A/PROM (deg) eval  Flexion 70 min Pain L   Extension 70 deg   Right lateral flexion 50 min pain on L   Left lateral flexion 42 min on pain R  Right rotation WNL pain on L   Left rotation WNL pain on L    (Blank rows = not tested)  UPPER EXTREMITY ROM:  Active ROM Right eval Left eval  Shoulder flexion  170  Shoulder extension    Shoulder abduction  170  Shoulder adduction    Shoulder extension    Shoulder internal rotation  *Full FR   Shoulder external rotation  *Full FR  Elbow flexion    Elbow extension    Wrist flexion    Wrist extension    Wrist ulnar deviation    Wrist radial deviation    Wrist pronation    Wrist supination     (Blank rows = not tested)  *Fingertips meet to touch midback   UPPER EXTREMITY MMT:  MMT Right eval Left eval  Shoulder flexion 5 4+  Shoulder extension    Shoulder abduction 5 4+  Shoulder adduction    Shoulder extension 5 4  Shoulder internal rotation    Shoulder external rotation    Middle trapezius 4+ 4  Lower trapezius    Elbow flexion 5 5  Elbow extension 5 4  Wrist flexion    Wrist extension    Wrist ulnar  deviation    Wrist radial deviation    Wrist pronation    Wrist supination    Grip strength 75 86   (Blank rows = not tested)  CERVICAL SPECIAL TESTS:  Upper limb tension test (ULTT): neg but with pain, Spurling's test: Positive, and Distraction test: no change in pain but did feel good NEG SPURLING: pain on L side of neck with Rt spurling, none in arm   FUNCTIONAL TESTS:  DNF-: 30 sec with mod difficulty   TODAY'S TREATMENT:     [facility] Adult PT Treatment:                                                DATE: 05/28/23 Therapeutic Exercise: UBE Level 2 2.5 min each way  Seated ROW 25# 10 x 1, 35# 10 x 1 Lat pull 25# x 10, 35# x 10 15# squat holding weight at chest x 10 15# DL x 10 Prone over Green exercise ball:  ITY x 10 each  Supine DNF lift 5 sec x 10 Supine serratus punches 2 4# dumbells Levator and UT stretch     [facility] Adult PT Treatment:                                                DATE: 05/23/23 Therapeutic Exercise: UBE level 2 3'/3' fwd/bwd while gathering subjective info Supine chin tucks 10x Supine serratus punch 3# x10 LLE L UT stretch 30s  x2 L levator stretch 30s x2 Prone L shoulder extension 3# 15x Prone L shoulder flexion 3# 15x Prone L hor abd 3# 15x (pain, smaller ROM better) Wall angels x10 Spider walks RTB around wrists to fatigue x2 Lateral wall walks RTB around wrists to fatigue x2       [facility] Adult PT Treatment:                                                DATE: 05/21/23 Therapeutic Exercise: Nustep L4 6 min Supine chin tucks 10x L UT stretch 30s x2 L levator stretch 30s x2 Prone L shoulder extension 3# 15x Prone L shoulder flexion 3# 15x Prone L hor abd 3# 15x                                                                                                                      DATE: 05/09/23   PATIENT EDUCATION:  Education details: PT, HEP, deep neck flexor, upper crossed syndrome, trigger points  Person educated: Patient Education  method: Explanation, Demonstration, Verbal cues, and Handouts Education comprehension: needs further education  HOME EXERCISE PROGRAM:   Access Code: [email or web address] URL: [email or web address] Date: 05/09/2023 Prepared by: Karie Mainland  Exercises - Supine Head Nod with Deep Neck Flexor Activation  - 1-2 x daily - 7 x weekly - 2 sets - 10 reps - 10 hold - Cervical Retraction with Resistance  - 1-2 x daily - 7 x weekly - 2 sets - 10 reps - 5 hold - Nerve Glides Ulnar    CLINICAL IMPRESSION:  Pt reports no pain on arrival. He would like to return to [employer]. Reviewed posterior chain options for gym which he did well with the machines and free weights with cues to stabilize neck with chin tuck.   Patient presents to PT reporting continued improvements in his pain and that he feels like the exercises have been helpful. Session today continued to focus on RTC and periscapular strengthening as well as stretching for Lt side of neck. He reports increased muscular fatigue at end of session, but does not endorse any increased pain except for mildly with prone horizontal abduction that abated with a smaller ROM. Patient was able to tolerate all prescribed exercises with no adverse effects. Patient continues to benefit from skilled PT services and should be progressed as able to improve functional independence.    OBJECTIVE IMPAIRMENTS: decreased strength, increased fascial restrictions, postural dysfunction, and pain.   ACTIVITY LIMITATIONS: carrying and lifting  PARTICIPATION LIMITATIONS: community activity and school  PERSONAL FACTORS: Age, Time since onset of injury/illness/exacerbation, and 1 comorbidity: hypermobile spine   are also affecting patient's functional outcome.   REHAB POTENTIAL: Excellent  CLINICAL DECISION MAKING: Evolving  EVALUATION COMPLEXITY: MOD   GOALS: Goals reviewed with patient? Yes  LONG TERM GOALS: Target date: 06/20/2023    Pt will  be I with  HEP for cervical and scapular stability, Shoulder strength  Baseline:  Goal status: INITIAL  2.  Pt will be able to demo pain free AROM of cervical spine  Baseline:  Goal status: INITIAL  3.  Pt will perform DNF test to 30 sec with no loss of capital flexion  Baseline: 30 sec with mod difficulty, chin lifts slightly  Goal status: INITIAL  4.  Pt will report resolution of L sided shoulder symptoms  Baseline:  Goal status: INITIAL  5.  Pt will demo 5/5 triceps strength for optimal shoulder function.  Baseline:  Goal status: INITIAL  6.  FOTO score will improve to 70%  Baseline: 54% Goal status: INITIAL   PLAN:  PT FREQUENCY: 2x/week  PT DURATION: 6 weeks  PLANNED INTERVENTIONS: 97164- PT Re-evaluation, 97110-Therapeutic exercises, 97530- Therapeutic activity, 97112- Neuromuscular re-education, 97535- Self Care, 13086- Manual therapy, 97014- Electrical stimulation (unattended), [id number]- Electrical stimulation (manual), Patient/Family education, Taping, Dry Needling, Spinal mobilization, Cryotherapy, and Moist heat  PLAN FOR NEXT SESSION: check HEP/ ulnar nerve glides, manual DN, stabilization (wall, prone )    Jannette Spanner, PTA 05/28/23 3:20 PM Phone: [phone] Fax: [phone]

## 2023-05-30 ENCOUNTER — Ambulatory Visit: Payer: Medicaid Other | Admitting: Physical Therapy

## 2023-05-30 ENCOUNTER — Encounter: Payer: Self-pay | Admitting: Physical Therapy

## 2023-05-30 DIAGNOSIS — M542 Cervicalgia: Secondary | ICD-10-CM

## 2023-05-30 DIAGNOSIS — M6281 Muscle weakness (generalized): Secondary | ICD-10-CM

## 2023-05-30 NOTE — Therapy (Signed)
OUTPATIENT PHYSICAL THERAPY TREATMENT NOTE   Patient Name: Daniel Wilcox MRN: [id number] DOB:28-Jul-2005, 17 y.o., male Today's Date: 05/30/2023  END OF SESSION:  PT End of Session - 05/30/23 1358     Visit Number 5    Number of Visits 12    Date for PT Re-Evaluation 06/20/23    Authorization Type [place] [employer]    Authorization Time Period 10 visits approved 05/21/23-07/19/22    Authorization - Number of Visits 10    PT Start Time 1400    PT Stop Time 1441    PT Time Calculation (min) 41 min               History reviewed. No pertinent past medical history. 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   Cervical radicular pain 02/07/2023   Chronic left shoulder pain 01/22/2023   Pure hypercholesterolemia 01/22/2023   Elevated alkaline phosphatase level 01/22/2023    PCP: Christel Mormon MD   REFERRING PROVIDER: Ralene Cork, DO  REFERRING DIAG: [phone] (ICD-10-CM) - Cervical radiculopathy  THERAPY DIAG:  Cervicalgia  Muscle weakness (generalized)  Rationale for Evaluation and Treatment: Rehabilitation  ONSET DATE: 18-24 mos  SUBJECTIVE:                                                                                                                                                                                                         SUBJECTIVE STATEMENT: Pt reports some increased soreness in his L UT, but no pain today.  Hand dominance: Right  PERTINENT HISTORY:  None relevant   PAIN:  Are you having pain? Yes: NPRS scale: 0/10 Pain location: L neck and L post arm Pain description: aching, heavy Aggravating factors: using arm  Relieving factors: massage chair, heat   PRECAUTIONS: None  RED FLAGS: None     WEIGHT BEARING RESTRICTIONS: No  FALLS:  Has patient fallen in last 6 months? 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  DIAGNOSTIC FINDINGS:  Reports XR (shoulder) in the past which was normal  PATIENT SURVEYS:  FOTO  54%  COGNITION: Overall cognitive status: Within functional limits for tasks assessed  SENSATION: WFL  POSTURE: forward head  PALPATION: TTP along L posterior shoulder, periscapular area and lower cervical spine  Hypermobility noted in C spine with P/A pressure, see AROM    CERVICAL ROM:   Active ROM A/PROM (deg) eval  Flexion 70 min Pain L   Extension 70 deg   Right lateral flexion 50 min pain on L   Left lateral flexion 42 min on pain R  Right rotation WNL pain on L   Left rotation WNL pain on L    (Blank rows = not tested)  UPPER EXTREMITY ROM:  Active ROM Right eval Left eval  Shoulder flexion  170  Shoulder extension    Shoulder abduction  170  Shoulder adduction    Shoulder extension    Shoulder internal rotation  *Full FR   Shoulder external rotation  *Full FR  Elbow flexion    Elbow extension    Wrist flexion    Wrist extension    Wrist ulnar deviation    Wrist radial deviation    Wrist pronation    Wrist supination     (Blank rows = not tested)  *Fingertips meet to touch midback   UPPER EXTREMITY MMT:  MMT Right eval Left eval  Shoulder flexion 5 4+  Shoulder extension    Shoulder abduction 5 4+  Shoulder adduction    Shoulder extension 5 4  Shoulder internal rotation    Shoulder external rotation    Middle trapezius 4+ 4  Lower trapezius    Elbow flexion 5 5  Elbow extension 5 4  Wrist flexion    Wrist extension    Wrist ulnar deviation    Wrist radial deviation     Wrist pronation    Wrist supination    Grip strength 75 86   (Blank rows = not tested)  CERVICAL SPECIAL TESTS:  Upper limb tension test (ULTT): neg but with pain, Spurling's test: Positive, and Distraction test: no change in pain but did feel good NEG SPURLING: pain on L side of neck with Rt spurling, none in arm   FUNCTIONAL TESTS:  DNF-: 30 sec with mod difficulty   TODAY'S TREATMENT:    [facility] Adult PT Treatment:                                                DATE: 05/30/23 Therapeutic Exercise: UBE Level 2 2.5 min each way  Seated low row - 35# 2x10, high row 30# - 2x10 Lat pull 35# 2x10 Prone over Green exercise ball:  ITY x10 each encouraged cervical retraction Supine DNF lift 10 sec x 6 Supine serratus punches 2 6# dumbells Chin tuck with rotation in supine  With PT OP  Manual therapy: Skilled palpation to identify trigger points for TDN STM to all listed muscles following TDN Lateral glide mid and upper cervical spine  Trigger Point Dry-Needling  Treatment instructions: Expect mild to moderate muscle soreness. S/S of pneumothorax if dry needled over a lung field, and to seek immediate medical attention should they occur. Patient verbalized understanding of these instructions and education.  Patient Consent Given: Yes Education handout provided: Yes Muscles treated: L UT Electrical stimulation performed: No Parameters: N/A Treatment response/outcome: twitch    [facility] Adult PT Treatment:  DATE: 05/28/23 Therapeutic Exercise: UBE Level 2 2.5 min each way  Seated ROW 25# 10 x 1, 35# 10 x 1 Lat pull 25# x 10, 35# x 10 15# squat holding weight at chest x 10 15# DL x 10 Prone over Green exercise ball:  ITY x 10 each  Supine DNF lift 5 sec x 10 Supine serratus punches 2 4# dumbells Levator and UT stretch     [facility] Adult PT Treatment:                                                DATE: 05/23/23 Therapeutic  Exercise: UBE level 2 3'/3' fwd/bwd while gathering subjective info Supine chin tucks 10x Supine serratus punch 3# x10 LLE L UT stretch 30s x2 L levator stretch 30s x2 Prone L shoulder extension 3# 15x Prone L shoulder flexion 3# 15x Prone L hor abd 3# 15x (pain, smaller ROM better) Wall angels x10 Spider walks RTB around wrists to fatigue x2 Lateral wall walks RTB around wrists to fatigue x2       [facility] Adult PT Treatment:                                                DATE: 05/21/23 Therapeutic Exercise: Nustep L4 6 min Supine chin tucks 10x L UT stretch 30s x2 L levator stretch 30s x2 Prone L shoulder extension 3# 15x Prone L shoulder flexion 3# 15x Prone L hor abd 3# 15x                                                                                                                      DATE: 05/09/23   PATIENT EDUCATION:  Education details: PT, HEP, deep neck flexor, upper crossed syndrome, trigger points  Person educated: Patient Education method: Explanation, Demonstration, Verbal cues, and Handouts Education comprehension: needs further education  HOME EXERCISE PROGRAM:   Access Code: [email or web address] URL: [email or web address] Date: 05/09/2023 Prepared by: Karie Mainland  Exercises - Supine Head Nod with Deep Neck Flexor Activation  - 1-2 x daily - 7 x weekly - 2 sets - 10 reps - 10 hold - Cervical Retraction with Resistance  - 1-2 x daily - 7 x weekly - 2 sets - 10 reps - 5 hold - Nerve Glides Ulnar    CLINICAL IMPRESSION:  Rally tolerated session well with no adverse reaction.  Trialed TDN L UT with twitch response.  Continued work on [employer] endurance, adding in rotation today.  Pt with L>R fatigue during row and lat pull down but is able to maintain good form and symmetry with minor cuing.  Pt reports improvement in L UE strength which is consistent with performance in clinic today.  Patient presents to PT  reporting continued improvements in his pain and  that he feels like the exercises have been helpful. Session today continued to focus on RTC and periscapular strengthening as well as stretching for Lt side of neck. He reports increased muscular fatigue at end of session, but does not endorse any increased pain except for mildly with prone horizontal abduction that abated with a smaller ROM. Patient was able to tolerate all prescribed exercises with no adverse effects. Patient continues to benefit from skilled PT services and should be progressed as able to improve functional independence.    OBJECTIVE IMPAIRMENTS: decreased strength, increased fascial restrictions, postural dysfunction, and pain.   ACTIVITY LIMITATIONS: carrying and lifting  PARTICIPATION LIMITATIONS: community activity and school  PERSONAL FACTORS: Age, Time since onset of injury/illness/exacerbation, and 1 comorbidity: hypermobile spine   are also affecting patient's functional outcome.   REHAB POTENTIAL: Excellent  CLINICAL DECISION MAKING: Evolving  EVALUATION COMPLEXITY: MOD   GOALS: Goals reviewed with patient? Yes  LONG TERM GOALS: Target date: 06/20/2023    Pt will be I with HEP for cervical and scapular stability, Shoulder strength  Baseline:  Goal status: INITIAL  2.  Pt will be able to demo pain free AROM of cervical spine  Baseline:  Goal status: INITIAL  3.  Pt will perform DNF test to 30 sec with no loss of capital flexion  Baseline: 30 sec with mod difficulty, chin lifts slightly  Goal status: INITIAL  4.  Pt will report resolution of L sided shoulder symptoms  Baseline:  Goal status: INITIAL  5.  Pt will demo 5/5 triceps strength for optimal shoulder function.  Baseline:  Goal status: INITIAL  6.  FOTO score will improve to 70%  Baseline: 54% Goal status: INITIAL   PLAN:  PT FREQUENCY: 2x/week  PT DURATION: 6 weeks  PLANNED INTERVENTIONS: 97164- PT Re-evaluation, 97110-Therapeutic exercises, 97530- Therapeutic activity, 97112-  Neuromuscular re-education, 97535- Self Care, 45409- Manual therapy, 97014- Electrical stimulation (unattended), [id number]- Electrical stimulation (manual), Patient/Family education, Taping, Dry Needling, Spinal mobilization, Cryotherapy, and Moist heat  PLAN FOR NEXT SESSION: check HEP/ ulnar nerve glides, manual DN, stabilization (wall, prone )    Alphonzo Severance PT, DPT 05/30/23 3:01 PM Phone: [phone] Fax: [phone]

## 2023-06-04 ENCOUNTER — Ambulatory Visit: Payer: Medicaid Other | Admitting: Physical Therapy

## 2023-06-04 ENCOUNTER — Encounter: Payer: Self-pay | Admitting: Physical Therapy

## 2023-06-04 DIAGNOSIS — M6281 Muscle weakness (generalized): Secondary | ICD-10-CM | POA: Diagnosis not present

## 2023-06-04 DIAGNOSIS — M542 Cervicalgia: Secondary | ICD-10-CM

## 2023-06-04 NOTE — Therapy (Signed)
OUTPATIENT PHYSICAL THERAPY TREATMENT NOTE   Patient Name: Daniel Wilcox MRN: [id number] DOB:[birth year], 17 y.o., male Today's Date: 06/04/2023  END OF SESSION:  PT End of Session - 06/04/23 1418     Visit Number 6    Number of Visits 12    Date for PT Re-Evaluation 06/20/23    Authorization Type [place] [employer]    Authorization Time Period 10 visits approved 05/21/23-07/19/22    Authorization - Visit Number 4    Authorization - Number of Visits 10    PT Start Time 1415    PT Stop Time 1500    PT Time Calculation (min) 45 min    Activity Tolerance Patient tolerated treatment well    Behavior During Therapy [facility] for tasks assessed/performed                History reviewed. No pertinent past medical history. 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   Cervical radicular pain 02/07/2023   Chronic left shoulder pain 01/22/2023   Pure hypercholesterolemia 01/22/2023   Elevated alkaline phosphatase level 01/22/2023    PCP: Christel Mormon MD   REFERRING PROVIDER: Ralene Cork, DO  REFERRING DIAG: [phone] (ICD-10-CM) - Cervical radiculopathy  THERAPY DIAG:  Cervicalgia  Muscle weakness (generalized)  Rationale for Evaluation and Treatment: Rehabilitation  ONSET DATE: 18-24 mos  SUBJECTIVE:                                                                                                                                                                                                         SUBJECTIVE STATEMENT: No pain I feel great, walked here today 45 min. He still has some L sided elbow numbness.   Hand dominance: Right  PERTINENT HISTORY:  None relevant   PAIN:  Are you having pain? Yes: NPRS scale: 0/10 Pain location: L neck and L post arm Pain description: aching, heavy Aggravating factors: using arm   Relieving factors: massage chair, heat   PRECAUTIONS: None  RED FLAGS: None     WEIGHT BEARING RESTRICTIONS: No  FALLS:  Has patient fallen in last 6 months? No  LIVING ENVIRONMENT: Lives with: lives with their family and on campus Lives in: Other on campus Stairs:  NA Has following equipment at home: None  OCCUPATION: Consulting civil engineer, Exploratory [employer]   PLOF: Independent  PATIENT GOALS: Pt wants to get in the best shape of my  life    OBJECTIVE:  Note: Objective measures were completed at Evaluation unless otherwise noted.  DIAGNOSTIC FINDINGS:  Reports XR (shoulder) in the past which was normal  PATIENT SURVEYS:  FOTO 54%  COGNITION: Overall cognitive status: Within functional limits for tasks assessed  SENSATION: WFL  POSTURE: forward head  PALPATION: TTP along L posterior shoulder, periscapular area and lower cervical spine  Hypermobility noted in C spine with P/A pressure, see AROM    CERVICAL ROM:   Active ROM A/PROM (deg) eval  Flexion 70 min Pain L   Extension 70 deg   Right lateral flexion 50 min pain on L   Left lateral flexion 42 min on pain R  Right rotation WNL pain on L   Left rotation WNL pain on L    (Blank rows = not tested)  UPPER EXTREMITY ROM:  Active ROM Right eval Left eval  Shoulder flexion  170  Shoulder extension    Shoulder abduction  170  Shoulder adduction    Shoulder extension    Shoulder internal rotation  *Full FR   Shoulder external rotation  *Full FR  Elbow flexion    Elbow extension    Wrist flexion    Wrist extension    Wrist ulnar deviation    Wrist radial deviation    Wrist pronation    Wrist supination     (Blank rows = not tested)  *Fingertips meet to touch midback   UPPER EXTREMITY MMT:  MMT Right eval Left eval  Shoulder flexion 5 4+  Shoulder extension    Shoulder abduction 5 4+  Shoulder adduction    Shoulder extension 5 4  Shoulder internal rotation    Shoulder external rotation     Middle trapezius 4+ 4  Lower trapezius    Elbow flexion 5 5  Elbow extension 5 4  Wrist flexion    Wrist extension    Wrist ulnar deviation    Wrist radial deviation    Wrist pronation    Wrist supination    Grip strength 75 86   (Blank rows = not tested)  CERVICAL SPECIAL TESTS:  Upper limb tension test (ULTT): neg but with pain, Spurling's test: Positive, and Distraction test: no change in pain but did feel good NEG SPURLING: pain on L side of neck with Rt spurling, none in arm   FUNCTIONAL TESTS:  DNF-: 30 sec with mod difficulty   TODAY'S TREATMENT:     [facility] Adult PT Treatment:                                                DATE: 06/04/23 Therapeutic Exercise: Supine chin tuck Single arm horizontal abd 8 lbs for stability  Double arm narrow press 10 lbs  x 12 Skull crusher 10 lbs double arm x 15  Sidelying reverse fly 6 lbs x 45 sec  Sidelying scaption 6 lbs  x 45 sec  Sideplank 30 sec each side  Horizontal abduction blue band Narrow grip overhead press Triceps push up on wall double arm , single arm   [facility] Adult PT Treatment:  DATE: 05/30/23 Therapeutic Exercise: UBE Level 2 2.5 min each way  Seated low row - 35# 2x10, high row 30# - 2x10 Lat pull 35# 2x10 Prone over Green exercise ball:  ITY x10 each encouraged cervical retraction Supine DNF lift 10 sec x 6 Supine serratus punches 2 6# dumbells Chin tuck with rotation in supine  With PT OP  Manual therapy: Skilled palpation to identify trigger points for TDN STM to all listed muscles following TDN Lateral glide mid and upper cervical spine  Trigger Point Dry-Needling  Treatment instructions: Expect mild to moderate muscle soreness. S/S of pneumothorax if dry needled over a lung field, and to seek immediate medical attention should they occur. Patient verbalized understanding of these instructions and education.  Patient Consent Given: Yes Education handout  provided: Yes Muscles treated: L UT Electrical stimulation performed: No Parameters: N/A Treatment response/outcome: twitch    [facility] Adult PT Treatment:                                                DATE: 05/28/23 Therapeutic Exercise: UBE Level 2 2.5 min each way  Seated ROW 25# 10 x 1, 35# 10 x 1 Lat pull 25# x 10, 35# x 10 15# squat holding weight at chest x 10 15# DL x 10 Prone over Green exercise ball:  ITY x 10 each  Supine DNF lift 5 sec x 10 Supine serratus punches 2 4# dumbells Levator and UT stretch     [facility] Adult PT Treatment:                                                DATE: 05/23/23 Therapeutic Exercise: UBE level 2 3'/3' fwd/bwd while gathering subjective info Supine chin tucks 10x Supine serratus punch 3# x10 LLE L UT stretch 30s x2 L levator stretch 30s x2 Prone L shoulder extension 3# 15x Prone L shoulder flexion 3# 15x Prone L hor abd 3# 15x (pain, smaller ROM better) Wall angels x10 Spider walks RTB around wrists to fatigue x2 Lateral wall walks RTB around wrists to fatigue x2       [facility] Adult PT Treatment:                                                DATE: 05/21/23 Therapeutic Exercise: Nustep L4 6 min Supine chin tucks 10x L UT stretch 30s x2 L levator stretch 30s x2 Prone L shoulder extension 3# 15x Prone L shoulder flexion 3# 15x Prone L hor abd 3# 15x  DATE: 05/09/23   PATIENT EDUCATION:  Education details: PT, HEP, deep neck flexor, upper crossed syndrome, trigger points  Person educated: Patient Education method: Explanation, Demonstration, Verbal cues, and Handouts Education comprehension: needs further education  HOME EXERCISE PROGRAM:   Access Code: [email or web address] URL: [email or web address] Date: 05/09/2023 Prepared by: Karie Mainland Access Code: [email or web address] URL:  [email or web address] Date: 06/04/2023 Prepared by: Karie Mainland  Exercises - Supine Head Nod with Deep Neck Flexor Activation  - 1-2 x daily - 7 x weekly - 2 sets - 10 reps - 10 hold - Cervical Retraction with Resistance  - 1-2 x daily - 7 x weekly - 2 sets - 10 reps - 5 hold - Standing Shoulder Horizontal Abduction with Resistance  - 1 x daily - 7 x weekly - 2 sets - 10 reps - 5 hold - Wall Angels  - 1 x daily - 7 x weekly - 2 sets - 10 reps - 5 hold - Prone Scapular Retraction Y  - 1 x daily - 7 x weekly - 2 sets - 10 reps - 5 hold  CLINICAL IMPRESSION:  Monti tolerated session well and is feeling more equal strength in his triceps. He is doing his neck stretches, chin tuck and band exercises.Patient was able to tolerate all prescribed exercises with no adverse effects. Patient continues to benefit from skilled PT services and should be progressed as able to improve functional independence.   OBJECTIVE IMPAIRMENTS: decreased strength, increased fascial restrictions, postural dysfunction, and pain.   ACTIVITY LIMITATIONS: carrying and lifting  PARTICIPATION LIMITATIONS: community activity and school  PERSONAL FACTORS: Age, Time since onset of injury/illness/exacerbation, and 1 comorbidity: hypermobile spine   are also affecting patient's functional outcome.   REHAB POTENTIAL: Excellent  CLINICAL DECISION MAKING: Evolving  EVALUATION COMPLEXITY: MOD   GOALS: Goals reviewed with patient? Yes  LONG TERM GOALS: Target date: 06/20/2023    Pt will be I with HEP for cervical and scapular stability, Shoulder strength  Baseline:  Goal status:ongoing   2.  Pt will be able to demo pain free AROM of cervical spine  Baseline:  Goal status:ongoing  3.  Pt will perform DNF test to 30 sec with no loss of capital flexion  Baseline: 30 sec with mod difficulty, chin lifts slightly  Goal status: ongoing   4.  Pt will report resolution of L sided shoulder symptoms   Baseline:  Goal status: INITIAL  5.  Pt will demo 5/5 triceps strength for optimal shoulder function.  Baseline:  Goal status: INITIAL  6.  FOTO score will improve to 70%  Baseline: 54% Goal status: INITIAL   PLAN:  PT FREQUENCY: 2x/week  PT DURATION: 6 weeks  PLANNED INTERVENTIONS: 97164- PT Re-evaluation, 97110-Therapeutic exercises, 97530- Therapeutic activity, 97112- Neuromuscular re-education, 97535- Self Care, 69629- Manual therapy, 97014- Electrical stimulation (unattended), [id number]- Electrical stimulation (manual), Patient/Family education, Taping, Dry Needling, Spinal mobilization, Cryotherapy, and Moist heat  PLAN FOR NEXT SESSION: check goals. Progress cervical and scapular stability   Karie Mainland, PT 06/04/23 3:59 PM Phone: [phone] Fax: [phone]

## 2023-06-06 ENCOUNTER — Ambulatory Visit: Payer: Medicaid Other | Admitting: Physical Therapy

## 2023-06-10 DIAGNOSIS — Z419 Encounter for procedure for purposes other than remedying health state, unspecified: Secondary | ICD-10-CM | POA: Diagnosis not present

## 2023-06-11 ENCOUNTER — Ambulatory Visit: Payer: Medicaid Other | Attending: Sports Medicine | Admitting: Physical Therapy

## 2023-06-11 ENCOUNTER — Encounter: Payer: Self-pay | Admitting: Physical Therapy

## 2023-06-11 DIAGNOSIS — M542 Cervicalgia: Secondary | ICD-10-CM | POA: Diagnosis not present

## 2023-06-11 DIAGNOSIS — M6281 Muscle weakness (generalized): Secondary | ICD-10-CM

## 2023-06-11 NOTE — Therapy (Signed)
OUTPATIENT PHYSICAL THERAPY TREATMENT NOTE   Patient Name: Daniel Wilcox MRN: [id number] DOB:18-Feb-2006, 17 y.o., male Today's Date: 06/11/2023  END OF SESSION:  PT End of Session - 06/11/23 1421     Visit Number 7    Number of Visits 12    Date for PT Re-Evaluation 06/20/23    Authorization Type [place] [employer]    Authorization Time Period 10 visits approved 05/21/23-07/19/22    Authorization - Visit Number 5    Authorization - Number of Visits 10    PT Start Time 1419    PT Stop Time 1506    PT Time Calculation (min) 47 min    Activity Tolerance Patient tolerated treatment well    Behavior During Therapy [facility] for tasks assessed/performed                 History reviewed. No pertinent past medical history. 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   Cervical radicular pain 02/07/2023   Chronic left shoulder pain 01/22/2023   Pure hypercholesterolemia 01/22/2023   Elevated alkaline phosphatase level 01/22/2023    PCP: Christel Mormon MD   REFERRING PROVIDER: Ralene Cork, DO  REFERRING DIAG: [phone] (ICD-10-CM) - Cervical radiculopathy  THERAPY DIAG:  Cervicalgia  Muscle weakness (generalized)  Rationale for Evaluation and Treatment: Rehabilitation  ONSET DATE: 18-24 mos  SUBJECTIVE:                                                                                                                                                                                                         SUBJECTIVE STATEMENT: Some L elbow numbness I think.  I have no pain right now.  He has not had pain since Friday.  I get pain in Lt. ant. Shoulder  (pt says Biceps)   Hand dominance: Right  PERTINENT HISTORY:  None relevant   PAIN:  Are you having pain? Yes: NPRS scale: 0/10 Pain location: L neck and L post arm Pain  description: aching, heavy Aggravating factors: using arm  Relieving factors: massage chair, heat   PRECAUTIONS: None  RED FLAGS: None     WEIGHT BEARING RESTRICTIONS: No  FALLS:  Has patient fallen in last 6 months? 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  DIAGNOSTIC FINDINGS:  Reports XR (shoulder) in the past which was normal  PATIENT SURVEYS:  Neck  FOTO 54%  06/11/23 73% DC  COGNITION: Overall cognitive status: Within functional limits for tasks assessed  SENSATION: WFL  POSTURE: forward head  PALPATION: TTP along L posterior shoulder, periscapular area and lower cervical spine  Hypermobility noted in C spine with P/A pressure, see AROM    CERVICAL ROM:   Active ROM A/PROM (deg) eval  Flexion 70 min Pain L   Extension 70 deg   Right lateral flexion 50 min pain on L   Left lateral flexion 42 min on pain R  Right rotation WNL pain on L   Left rotation WNL pain on L    (Blank rows = not tested)  UPPER EXTREMITY ROM:  Active ROM Right eval Left eval  Shoulder flexion  170  Shoulder extension    Shoulder abduction  170  Shoulder adduction    Shoulder extension    Shoulder internal rotation  *Full FR   Shoulder external rotation  *Full FR  Elbow flexion    Elbow extension    Wrist flexion    Wrist extension    Wrist ulnar deviation    Wrist radial deviation    Wrist pronation    Wrist supination     (Blank rows = not tested)  *Fingertips meet to touch midback   UPPER EXTREMITY MMT:  MMT Right eval Left eval  Shoulder flexion 5 4+  Shoulder extension    Shoulder abduction 5 4+  Shoulder adduction     Shoulder extension 5 4  Shoulder internal rotation    Shoulder external rotation    Middle trapezius 4+ 4  Lower trapezius    Elbow flexion 5 5  Elbow extension 5 4  Wrist flexion    Wrist extension    Wrist ulnar deviation    Wrist radial deviation    Wrist pronation    Wrist supination    Grip strength 75 86   (Blank rows = not tested)  CERVICAL SPECIAL TESTS:  Upper limb tension test (ULTT): neg but with pain, Spurling's test: Positive, and Distraction test: no change in pain but did feel good NEG SPURLING: pain on L side of neck with Rt spurling, none in arm   FUNCTIONAL TESTS:  DNF-: 30 sec with mod difficulty   TODAY'S TREATMENT:    [facility] Adult PT Treatment:                                                DATE: 06/11/23 Therapeutic Exercise: UBE in reverse level 3 -5 min  Forward flexion blue band double arm full ROM then pulse Bent elbow ER, flexion (side/facing) blue band full ROM then ER pulse  Opposite facing for IR  Reformer overhead press 1 red 1 yellow double arm  x 15 then 1 red for single arm  x 15  Shrug and plug scapular depression  [place] x 10  1 red Reformer pulling straps 1 red x 10, triceps x 10 Seated single arm pull with rotation 1 red x 10 each    Self Care: scapular stability, pain in elbow (causes)  working opposing muscle group and structuring workouts  [facility] Adult PT Treatment:                                                DATE: 06/04/23 Therapeutic Exercise: Supine chin tuck Single arm horizontal abd 8 lbs for stability  Double arm narrow press 10 lbs  x 12 Skull crusher 10 lbs double arm x 15  Sidelying reverse fly 6 lbs x 45 sec  Sidelying scaption 6 lbs  x 45 sec  Sideplank 30 sec each side  Horizontal abduction blue band Narrow grip overhead press Triceps push up on wall double arm , single arm   [facility] Adult PT Treatment:                                                DATE: 05/30/23 Therapeutic Exercise: UBE Level 2 2.5 min each way   Seated low row - 35# 2x10, high row 30# - 2x10 Lat pull 35# 2x10 Prone over Green exercise ball:  ITY x10 each encouraged cervical retraction Supine DNF lift 10 sec x 6 Supine serratus punches 2 6# dumbells Chin tuck with rotation in supine  With PT OP  Manual therapy: Skilled palpation to identify trigger points for TDN STM to all listed muscles following TDN Lateral glide mid and upper cervical spine  Trigger Point Dry-Needling  Treatment instructions: Expect mild to moderate muscle soreness. S/S of pneumothorax if dry needled over a lung field, and to seek immediate medical attention should they occur. Patient verbalized understanding of these instructions and education.  Patient Consent Given: Yes Education handout provided: Yes Muscles treated: L UT Electrical stimulation performed: No Parameters: N/A Treatment response/outcome: twitch    [facility] Adult PT Treatment:                                                DATE: 05/28/23 Therapeutic Exercise: UBE Level 2 2.5 min each way  Seated ROW 25# 10 x 1, 35# 10 x 1 Lat pull 25# x 10, 35# x 10 15# squat holding weight at chest x 10 15# DL x 10 Prone over Green exercise ball:  ITY x 10 each  Supine DNF lift 5 sec x 10 Supine serratus punches 2 4# dumbells Levator and UT stretch     [facility] Adult PT Treatment:                                                DATE: 05/23/23 Therapeutic Exercise: UBE level 2 3'/3' fwd/bwd while gathering subjective info Supine chin tucks 10x Supine serratus punch 3# x10 LLE L UT stretch 30s x2 L levator stretch 30s x2 Prone L shoulder extension 3# 15x Prone L shoulder flexion 3# 15x Prone L hor abd 3# 15x (pain, smaller ROM better) Wall angels x10 Spider walks RTB around wrists to fatigue x2 Lateral wall walks RTB around wrists to fatigue x2       [facility] Adult  PT Treatment:                                                DATE: 05/21/23 Therapeutic Exercise: Nustep L4 6 min Supine chin tucks  10x L UT stretch 30s x2 L levator stretch 30s x2 Prone L shoulder extension 3# 15x Prone L shoulder flexion 3# 15x Prone L hor abd 3# 15x                                                                                                                      DATE: 05/09/23   PATIENT EDUCATION:  Education details: PT, HEP, deep neck flexor, upper crossed syndrome, trigger points  Person educated: Patient Education method: Explanation, Demonstration, Verbal cues, and Handouts Education comprehension: needs further education  HOME EXERCISE PROGRAM:   Access Code: [email or web address] URL: [email or web address] Date: 05/09/2023 Prepared by: Karie Mainland Access Code: [email or web address] URL: [email or web address] Date: 06/04/2023 Prepared by: Karie Mainland  Exercises - Supine Head Nod with Deep Neck Flexor Activation  - 1-2 x daily - 7 x weekly - 2 sets - 10 reps - 10 hold - Cervical Retraction with Resistance  - 1-2 x daily - 7 x weekly - 2 sets - 10 reps - 5 hold - Standing Shoulder Horizontal Abduction with Resistance  - 1 x daily - 7 x weekly - 2 sets - 10 reps - 5 hold - Wall Angels  - 1 x daily - 7 x weekly - 2 sets - 10 reps - 5 hold - Prone Scapular Retraction Y  - 1 x daily - 7 x weekly - 2 sets - 10 reps - 5 hold  CLINICAL IMPRESSION:  Kyier has noted less pain overall and is able to exercise during session today without increasing pain just fatigue in left triceps and left periscapular area.  He has excellent active range of motion his issue is more in stabilization of his left scapula.  The reformer was beneficial and isolating shoulder mechanics with overhead press motions.  He will continue to benefit from skilled physical therapy for the remaining visits he has scheduled.  OBJECTIVE IMPAIRMENTS: decreased strength, increased fascial restrictions, postural dysfunction, and pain.   ACTIVITY LIMITATIONS: carrying and lifting  PARTICIPATION LIMITATIONS: community  activity and school  PERSONAL FACTORS: Age, Time since onset of injury/illness/exacerbation, and 1 comorbidity: hypermobile spine   are also affecting patient's functional outcome.   REHAB POTENTIAL: Excellent  CLINICAL DECISION MAKING: Evolving  EVALUATION COMPLEXITY: MOD   GOALS: Goals reviewed with patient? Yes  LONG TERM GOALS: Target date: 06/20/2023    Pt will be I with HEP for cervical and scapular stability, Shoulder strength  Baseline:  Goal status:ongoing   2.  Pt will be able to demo pain free AROM of cervical spine  Baseline:  Goal status:ongoing  3.  Pt will perform DNF test to 30  sec with no loss of capital flexion  Baseline: 30 sec with mod difficulty, chin lifts slightly  Goal status: ongoing   4.  Pt will report resolution of L sided shoulder symptoms  Baseline: Gets left-sided neck tension from time to time as well as the left biceps tendon discomfort Goal status: ongoing   5.  Pt will demo 5/5 triceps strength for optimal shoulder function.  Baseline:  Goal status: INITIAL  6.  FOTO score will improve to 70%  Baseline: 54%, 73% Goal status: MET    PLAN:  PT FREQUENCY: 2x/week  PT DURATION: 6 weeks  PLANNED INTERVENTIONS: 97164- PT Re-evaluation, 97110-Therapeutic exercises, 97530- Therapeutic activity, 97112- Neuromuscular re-education, 97535- Self Care, 27253- Manual therapy, 97014- Electrical stimulation (unattended), [id number]- Electrical stimulation (manual), Patient/Family education, Taping, Dry Needling, Spinal mobilization, Cryotherapy, and Moist heat  PLAN FOR NEXT SESSION:  Progress cervical and scapular stability close chain scapular stabilization  Karie Mainland, PT 06/11/23 3:16 PM Phone: [phone] Fax: [phone]

## 2023-06-13 ENCOUNTER — Ambulatory Visit: Payer: Medicaid Other

## 2023-06-13 DIAGNOSIS — M6281 Muscle weakness (generalized): Secondary | ICD-10-CM

## 2023-06-13 DIAGNOSIS — M542 Cervicalgia: Secondary | ICD-10-CM

## 2023-06-13 NOTE — Therapy (Signed)
OUTPATIENT PHYSICAL THERAPY TREATMENT NOTE   Patient Name: Daniel Wilcox MRN: [id number] DOB:2006-01-27, 17 y.o., male Today's Date: 06/13/2023  END OF SESSION:  PT End of Session - 06/13/23 1350     Visit Number 8    Number of Visits 12    Date for PT Re-Evaluation 06/20/23    Authorization Type [place] [employer]    Authorization Time Period 10 visits approved 05/21/23-07/19/22    Authorization - Visit Number 6    Authorization - Number of Visits 10    PT Start Time 1400    PT Stop Time 1440    PT Time Calculation (min) 40 min    Activity Tolerance Patient tolerated treatment well    Behavior During Therapy [facility] for tasks assessed/performed             History reviewed. No pertinent past medical history. 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   Cervical radicular pain 02/07/2023   Chronic left shoulder pain 01/22/2023   Pure hypercholesterolemia 01/22/2023   Elevated alkaline phosphatase level 01/22/2023    PCP: Christel Mormon MD   REFERRING PROVIDER: Ralene Cork, DO  REFERRING DIAG: [phone] (ICD-10-CM) - Cervical radiculopathy  THERAPY DIAG:  Cervicalgia  Muscle weakness (generalized)  Rationale for Evaluation and Treatment: Rehabilitation  ONSET DATE: 18-24 mos  SUBJECTIVE:                                                                                                                                                                                                         SUBJECTIVE STATEMENT: Patient reports no current pain, only muscle soreness from completing exercises at the gym.   Hand dominance: Right  PERTINENT HISTORY:  None relevant   PAIN:  Are you having pain? Yes: NPRS scale: 0/10 Pain location: L neck and L post arm Pain description: aching, heavy Aggravating factors: using arm  Relieving  factors: massage chair, heat   PRECAUTIONS: None  RED FLAGS: None     WEIGHT BEARING RESTRICTIONS: No  FALLS:  Has patient fallen in last 6 months? No  LIVING ENVIRONMENT: Lives with: lives with their family and on campus Lives in: Other on campus Stairs:  NA Has following equipment at home: None  OCCUPATION: Consulting civil engineer, Exploratory [employer]   PLOF: Independent  PATIENT GOALS: Pt wants to get in the best shape of my life    OBJECTIVE:  Note:  Objective measures were completed at Evaluation unless otherwise noted.  DIAGNOSTIC FINDINGS:  Reports XR (shoulder) in the past which was normal  PATIENT SURVEYS:  Neck  FOTO 54%  06/11/23 73% DC  COGNITION: Overall cognitive status: Within functional limits for tasks assessed  SENSATION: WFL  POSTURE: forward head  PALPATION: TTP along L posterior shoulder, periscapular area and lower cervical spine  Hypermobility noted in C spine with P/A pressure, see AROM    CERVICAL ROM:   Active ROM A/PROM (deg) eval  Flexion 70 min Pain L   Extension 70 deg   Right lateral flexion 50 min pain on L   Left lateral flexion 42 min on pain R  Right rotation WNL pain on L   Left rotation WNL pain on L    (Blank rows = not tested)  UPPER EXTREMITY ROM:  Active ROM Right eval Left eval  Shoulder flexion  170  Shoulder extension    Shoulder abduction  170  Shoulder adduction    Shoulder extension    Shoulder internal rotation  *Full FR   Shoulder external rotation  *Full FR  Elbow flexion    Elbow extension    Wrist flexion    Wrist extension    Wrist ulnar deviation    Wrist radial deviation    Wrist pronation    Wrist supination     (Blank rows = not tested)  *Fingertips meet to touch midback   UPPER EXTREMITY MMT:  MMT Right eval Left eval  Shoulder flexion 5 4+  Shoulder extension    Shoulder abduction 5 4+  Shoulder adduction    Shoulder extension 5 4  Shoulder internal rotation    Shoulder external  rotation    Middle trapezius 4+ 4  Lower trapezius    Elbow flexion 5 5  Elbow extension 5 4  Wrist flexion    Wrist extension    Wrist ulnar deviation    Wrist radial deviation    Wrist pronation    Wrist supination    Grip strength 75 86   (Blank rows = not tested)  CERVICAL SPECIAL TESTS:  Upper limb tension test (ULTT): neg but with pain, Spurling's test: Positive, and Distraction test: no change in pain but did feel good NEG SPURLING: pain on L side of neck with Rt spurling, none in arm   FUNCTIONAL TESTS:  DNF-: 30 sec with mod difficulty   TODAY'S TREATMENT:   [facility] Adult PT Treatment:                                                DATE: 06/13/23 Therapeutic Exercise: UBE level 2 x3'/3' fwd/bwd while gathering subjective Triceps push up on wall 2x10 Bus drivers GTB 1U27" Manual therapy (performed by certified therapist Anne Ng, DPT): Skilled palpation to identify trigger points for TDN STM to all listed muscles following TDN  Lateral glide mid and upper cervical spine  Trigger Point Dry-Needling  Treatment instructions: Expect mild to moderate muscle soreness. S/S of pneumothorax if dry needled over a lung field, and to seek immediate medical attention should they occur. Patient verbalized understanding of these instructions and education.  Patient Consent Given: Yes Education handout provided: Yes Muscles treated: L UT Electrical stimulation performed: No Parameters: N/A Treatment response/outcome: twitch   [facility] Adult PT Treatment:  DATE: 06/11/23 Therapeutic Exercise: UBE in reverse level 3 -5 min  Forward flexion blue band double arm full ROM then pulse Bent elbow ER, flexion (side/facing) blue band full ROM then ER pulse  Opposite facing for IR  Reformer overhead press 1 red 1 yellow double arm  x 15 then 1 red for single arm  x 15  Shrug and plug scapular depression  Swan x 10  1 red Reformer pulling straps  1 red x 10, triceps x 10 Seated single arm pull with rotation 1 red x 10 each    Self Care: scapular stability, pain in elbow (causes) working opposing muscle group and structuring workouts  [facility] Adult PT Treatment:                                                DATE: 06/04/23 Therapeutic Exercise: Supine chin tuck Single arm horizontal abd 8 lbs for stability  Double arm narrow press 10 lbs  x 12 Skull crusher 10 lbs double arm x 15  Sidelying reverse fly 6 lbs x 45 sec  Sidelying scaption 6 lbs  x 45 sec  Sideplank 30 sec each side  Horizontal abduction blue band Narrow grip overhead press Triceps push up on wall double arm , single arm    PATIENT EDUCATION:  Education details: PT, HEP, deep neck flexor, upper crossed syndrome, trigger points  Person educated: Patient Education method: Programmer, multimedia, Demonstration, Verbal cues, and Handouts Education comprehension: needs further education  HOME EXERCISE PROGRAM:   Access Code: [email or web address] URL: [email or web address] Date: 05/09/2023 Prepared by: Karie Mainland Access Code: [email or web address] URL: [email or web address] Date: 06/04/2023 Prepared by: Karie Mainland  Exercises - Supine Head Nod with Deep Neck Flexor Activation  - 1-2 x daily - 7 x weekly - 2 sets - 10 reps - 10 hold - Cervical Retraction with Resistance  - 1-2 x daily - 7 x weekly - 2 sets - 10 reps - 5 hold - Standing Shoulder Horizontal Abduction with Resistance  - 1 x daily - 7 x weekly - 2 sets - 10 reps - 5 hold - Wall Angels  - 1 x daily - 7 x weekly - 2 sets - 10 reps - 5 hold - Prone Scapular Retraction Y  - 1 x daily - 7 x weekly - 2 sets - 10 reps - 5 hold   CLINICAL IMPRESSION:  Patient presents to PT reporting no current pain and that he has been going to the gym more frequently with no issues. TPDN performed this session by certified therapist Anne Ng, DPT with patient reporting decreased tension after. Session today continued to  focus on scapular stabilization and strengthening. Patient was able to tolerate all prescribed exercises with no adverse effects. Patient continues to benefit from skilled PT services and should be progressed as able to improve functional independence.    OBJECTIVE IMPAIRMENTS: decreased strength, increased fascial restrictions, postural dysfunction, and pain.   ACTIVITY LIMITATIONS: carrying and lifting  PARTICIPATION LIMITATIONS: community activity and school  PERSONAL FACTORS: Age, Time since onset of injury/illness/exacerbation, and 1 comorbidity: hypermobile spine   are also affecting patient's functional outcome.   REHAB POTENTIAL: Excellent  CLINICAL DECISION MAKING: Evolving  EVALUATION COMPLEXITY: MOD   GOALS: Goals reviewed with patient? Yes  LONG TERM GOALS: Target date: 06/20/2023    Pt will be I with HEP for cervical  and scapular stability, Shoulder strength  Baseline:  Goal status:ongoing   2.  Pt will be able to demo pain free AROM of cervical spine  Baseline:  Goal status:ongoing  3.  Pt will perform DNF test to 30 sec with no loss of capital flexion  Baseline: 30 sec with mod difficulty, chin lifts slightly  Goal status: ongoing   4.  Pt will report resolution of L sided shoulder symptoms  Baseline: Gets left-sided neck tension from time to time as well as the left biceps tendon discomfort Goal status: ongoing   5.  Pt will demo 5/5 triceps strength for optimal shoulder function.  Baseline:  Goal status: INITIAL  6.  FOTO score will improve to 70%  Baseline: 54%, 73% Goal status: MET    PLAN:  PT FREQUENCY: 2x/week  PT DURATION: 6 weeks  PLANNED INTERVENTIONS: 97164- PT Re-evaluation, 97110-Therapeutic exercises, 97530- Therapeutic activity, 97112- Neuromuscular re-education, 97535- Self Care, [id number]- Manual therapy, 97014- Electrical stimulation (unattended), [id number]- Electrical stimulation (manual), Patient/Family education, Taping, Dry Needling,  Spinal mobilization, Cryotherapy, and Moist heat  PLAN FOR NEXT SESSION:  Progress cervical and scapular stability close chain scapular stabilization  Berta Minor PTA 06/13/23 2:39 PM Phone: [phone] Fax: [phone]

## 2023-07-11 DIAGNOSIS — Z419 Encounter for procedure for purposes other than remedying health state, unspecified: Secondary | ICD-10-CM | POA: Diagnosis not present

## 2023-07-16 ENCOUNTER — Ambulatory Visit: Payer: Medicaid Other | Admitting: Physical Therapy

## 2023-07-16 NOTE — Therapy (Deleted)
 OUTPATIENT PHYSICAL THERAPY TREATMENT NOTE   Patient Name: Daniel Wilcox MRN: [id number] DOB:2005-10-02, 18 y.o., male Today's Date: 07/16/2023  END OF SESSION:    No past medical history on file. 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  Rationale for Evaluation and Treatment: Rehabilitation  ONSET DATE: 18-24 mos  SUBJECTIVE:                                                                                                                                                                                                         SUBJECTIVE STATEMENT:   Pt has not been here since 06/13/23.  Auth still good ***  Patient reports no current pain, only muscle soreness from completing exercises at the gym.   Hand dominance: Right  PERTINENT HISTORY:  None relevant   PAIN:  Are you having pain? Yes: NPRS scale: 0/10 Pain location: L neck and L post arm Pain description: aching, heavy Aggravating factors: using arm  Relieving factors: massage chair, heat   PRECAUTIONS: None  RED FLAGS: None     WEIGHT BEARING RESTRICTIONS: No  FALLS:  Has patient fallen in last 6 months? No  LIVING ENVIRONMENT: Lives with: lives with their family and on campus Lives in: Other on campus Stairs:  NA Has following equipment at home: None  OCCUPATION: Consulting Civil Engineer, Exploratory [employer]   PLOF: Independent  PATIENT GOALS: Pt wants to get in the best shape of my life    OBJECTIVE:  Note: Objective measures  were completed at Evaluation unless otherwise noted.  DIAGNOSTIC FINDINGS:  Reports XR (shoulder) in the past which was normal  PATIENT SURVEYS:  Neck  FOTO 54%  06/11/23 73% DC  COGNITION: Overall cognitive status: Within functional limits for tasks assessed  SENSATION: WFL  POSTURE: forward head  PALPATION: TTP along L posterior shoulder, periscapular area and lower cervical spine  Hypermobility noted in C spine with P/A pressure, see AROM    CERVICAL ROM:   Active ROM A/PROM (deg) eval  Flexion 70 min Pain L   Extension 70 deg   Right lateral flexion  50 min pain on L   Left lateral flexion 42 min on pain R  Right rotation WNL pain on L   Left rotation WNL pain on L    (Blank rows = not tested)  UPPER EXTREMITY ROM:  Active ROM Right eval Left eval  Shoulder flexion  170  Shoulder extension    Shoulder abduction  170  Shoulder adduction    Shoulder extension    Shoulder internal rotation  *Full FR   Shoulder external rotation  *Full FR  Elbow flexion    Elbow extension    Wrist flexion    Wrist extension    Wrist ulnar deviation    Wrist radial deviation    Wrist pronation    Wrist supination     (Blank rows = not tested)  *Fingertips meet to touch midback   UPPER EXTREMITY MMT:  MMT Right eval Left eval  Shoulder flexion 5 4+  Shoulder extension    Shoulder abduction 5 4+  Shoulder adduction    Shoulder extension 5 4  Shoulder internal rotation    Shoulder external rotation    Middle trapezius 4+ 4  Lower trapezius    Elbow flexion 5 5  Elbow extension 5 4  Wrist flexion    Wrist extension    Wrist ulnar deviation    Wrist radial deviation    Wrist pronation    Wrist supination    Grip strength 75 86   (Blank rows = not tested)  CERVICAL SPECIAL TESTS:  Upper limb tension test (ULTT): neg but with pain, Spurling's test: Positive, and Distraction test: no change in pain but did feel good NEG SPURLING: pain on L side of neck with Rt  spurling, none in arm   FUNCTIONAL TESTS:  DNF-: 30 sec with mod difficulty   TODAY'S TREATMENT:    [facility] Adult PT Treatment:                                                DATE: 07/16/23 Therapeutic Exercise: *** Manual Therapy: *** Neuromuscular re-ed: *** Therapeutic Activity: *** Modalities: *** Self Care: ***   [facility] Adult PT Treatment:                                                DATE: 06/13/23 Therapeutic Exercise: UBE level 2 x3'/3' fwd/bwd while gathering subjective Triceps push up on wall 2x10 Bus drivers GTB [id number] Manual therapy (performed by certified therapist Monica Fresh, DPT): Skilled palpation to identify trigger points for TDN STM to all listed muscles following TDN  Lateral glide mid and upper cervical spine  Trigger Point Dry-Needling  Treatment instructions: Expect mild to moderate muscle soreness. S/S of pneumothorax if dry needled over a lung field, and to seek immediate medical attention should they occur. Patient verbalized understanding of these instructions and education.  Patient Consent Given: Yes Education handout provided: Yes Muscles treated: L UT Electrical stimulation performed: No Parameters: N/A Treatment response/outcome: twitch   [facility] Adult PT Treatment:  DATE: 06/11/23 Therapeutic Exercise: UBE in reverse level 3 -5 min  Forward flexion blue band double arm full ROM then pulse Bent elbow ER, flexion (side/facing) blue band full ROM then ER pulse  Opposite facing for IR  Reformer overhead press 1 red 1 yellow double arm  x 15 then 1 red for single arm  x 15  Shrug and plug scapular depression  Swan x 10  1 red Reformer pulling straps 1 red x 10, triceps x 10 Seated single arm pull with rotation 1 red x 10 each    Self Care: scapular stability, pain in elbow (causes) working opposing muscle group and structuring workouts  [facility] Adult PT Treatment:                                                 DATE: 06/04/23 Therapeutic Exercise: Supine chin tuck Single arm horizontal abd 8 lbs for stability  Double arm narrow press 10 lbs  x 12 Skull crusher 10 lbs double arm x 15  Sidelying reverse fly 6 lbs x 45 sec  Sidelying scaption 6 lbs  x 45 sec  Sideplank 30 sec each side  Horizontal abduction blue band Narrow grip overhead press Triceps push up on wall double arm , single arm    PATIENT EDUCATION:  Education details: PT, HEP, deep neck flexor, upper crossed syndrome, trigger points  Person educated: Patient Education method: Programmer, Multimedia, Demonstration, Verbal cues, and Handouts Education comprehension: needs further education  HOME EXERCISE PROGRAM:   Access Code: [email or web address] URL: [email or web address] Date: 05/09/2023 Prepared by: Delon Norma Access Code: [email or web address] URL: [email or web address] Date: 06/04/2023 Prepared by: Delon Norma  Exercises - Supine Head Nod with Deep Neck Flexor Activation  - 1-2 x daily - 7 x weekly - 2 sets - 10 reps - 10 hold - Cervical Retraction with Resistance  - 1-2 x daily - 7 x weekly - 2 sets - 10 reps - 5 hold - Standing Shoulder Horizontal Abduction with Resistance  - 1 x daily - 7 x weekly - 2 sets - 10 reps - 5 hold - Wall Angels  - 1 x daily - 7 x weekly - 2 sets - 10 reps - 5 hold - Prone Scapular Retraction Y  - 1 x daily - 7 x weekly - 2 sets - 10 reps - 5 hold   CLINICAL IMPRESSION:  Patient presents to PT reporting no current pain and that he has been going to the gym more frequently with no issues. TPDN performed this session by certified therapist Monica Fresh, DPT with patient reporting decreased tension after. Session today continued to focus on scapular stabilization and strengthening. Patient was able to tolerate all prescribed exercises with no adverse effects. Patient continues to benefit from skilled PT services and should be progressed as able to improve functional independence.     OBJECTIVE IMPAIRMENTS: decreased strength, increased fascial restrictions, postural dysfunction, and pain.   ACTIVITY LIMITATIONS: carrying and lifting  PARTICIPATION LIMITATIONS: community activity and school  PERSONAL FACTORS: Age, Time since onset of injury/illness/exacerbation, and 1 comorbidity: hypermobile spine   are also affecting patient's functional outcome.   REHAB POTENTIAL: Excellent  CLINICAL DECISION MAKING: Evolving  EVALUATION COMPLEXITY: MOD   GOALS: Goals reviewed with patient? Yes  LONG TERM GOALS: Target date: 06/20/2023    Pt will be I with HEP for cervical  and scapular stability, Shoulder strength  Baseline:  Goal status:ongoing   2.  Pt will be able to demo pain free AROM of cervical spine  Baseline:  Goal status:ongoing  3.  Pt will perform DNF test to 30 sec with no loss of capital flexion  Baseline: 30 sec with mod difficulty, chin lifts slightly  Goal status: ongoing   4.  Pt will report resolution of L sided shoulder symptoms  Baseline: Gets left-sided neck tension from time to time as well as the left biceps tendon discomfort Goal status: ongoing   5.  Pt will demo 5/5 triceps strength for optimal shoulder function.  Baseline:  Goal status: INITIAL  6.  FOTO score will improve to 70%  Baseline: 54%, 73% Goal status: MET    PLAN:  PT FREQUENCY: 2x/week  PT DURATION: 6 weeks  PLANNED INTERVENTIONS: 97164- PT Re-evaluation, 97110-Therapeutic exercises, 97530- Therapeutic activity, 97112- Neuromuscular re-education, 97535- Self Care, [id number]- Manual therapy, 97014- Electrical stimulation (unattended), [id number]- Electrical stimulation (manual), Patient/Family education, Taping, Dry Needling, Spinal mobilization, Cryotherapy, and Moist heat  PLAN FOR NEXT SESSION:  Progress cervical and scapular stability close chain scapular stabilization  Delon LITTIE Norma PT 07/16/23 7:39 AM Phone: [phone] Fax: [phone]

## 2023-07-24 ENCOUNTER — Ambulatory Visit: Payer: Self-pay | Admitting: Internal Medicine

## 2023-07-25 ENCOUNTER — Ambulatory Visit: Payer: Medicaid Other | Attending: Sports Medicine | Admitting: Physical Therapy

## 2023-07-25 DIAGNOSIS — M542 Cervicalgia: Secondary | ICD-10-CM | POA: Insufficient documentation

## 2023-07-25 DIAGNOSIS — M6281 Muscle weakness (generalized): Secondary | ICD-10-CM | POA: Diagnosis not present

## 2023-07-25 NOTE — Therapy (Addendum)
OUTPATIENT PHYSICAL THERAPY TREATMENT NOTE   Patient Name: Daniel Wilcox MRN: [id number] DOB:03-20-2006, 18 y.o., male Today's Date: 07/25/2023  END OF SESSION:  PT End of Session - 07/25/23 1550     Visit Number 9    Authorization Type [employer]    Authorization Time Period 10 visits approved 05/21/23-07/19/22    Authorization - Visit Number 7    Authorization - Number of Visits 10    PT Start Time 1548    PT Stop Time 1630    PT Time Calculation (min) 42 min    Activity Tolerance Patient tolerated treatment well    Behavior During Therapy [facility] for tasks assessed/performed              No past medical history on file. 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   Cervical radicular pain 02/07/2023   Chronic left shoulder pain 01/22/2023   Pure hypercholesterolemia 01/22/2023   Elevated alkaline phosphatase level 01/22/2023    PCP: Christel Mormon MD   REFERRING PROVIDER: Ralene Cork, DO  REFERRING DIAG: [phone] (ICD-10-CM) - Cervical radiculopathy  THERAPY DIAG:  Cervicalgia  Muscle weakness (generalized)  Rationale for Evaluation and Treatment: Rehabilitation  ONSET DATE: 18-24 mos  SUBJECTIVE:                                                                                                                                                                                                         SUBJECTIVE STATEMENT:   Pt has been doing great.  Has been working on posture, is lifting more weight and feeling less pain overall .  He is agreeable to DC and would like to get dry needling one more time.    Hand dominance: Right  PERTINENT HISTORY:  None relevant   PAIN:  Are you having pain? Yes: NPRS scale: 0/10 Pain location: L neck and L post arm Pain description: aching, heavy Aggravating factors: using  arm  Relieving factors: massage chair, heat   PRECAUTIONS: None  RED FLAGS: None     WEIGHT BEARING RESTRICTIONS: No  FALLS:  Has patient fallen in last 6 months? No  LIVING ENVIRONMENT: Lives with: lives with their family and on campus Lives in: Other on campus Stairs:  NA Has following equipment at home: None  OCCUPATION: Consulting civil engineer, Exploratory [employer]   PLOF: Independent  PATIENT GOALS: Pt wants to get in the  best shape of my life    OBJECTIVE:  Note: Objective measures were completed at Evaluation unless otherwise noted.  DIAGNOSTIC FINDINGS:  Reports XR (shoulder) in the past which was normal  PATIENT SURVEYS:  Neck  FOTO 54%  06/11/23 73% DC  COGNITION: Overall cognitive status: Within functional limits for tasks assessed  SENSATION: WFL  POSTURE: forward head  PALPATION: TTP along L posterior shoulder, periscapular area and lower cervical spine  Hypermobility noted in C spine with P/A pressure, see AROM    CERVICAL ROM:   Active ROM A/PROM (deg) eval AROM 07/25/23  Flexion 70 min Pain L  70  Extension 70 deg  68  Right lateral flexion 50 min pain on L  45  Left lateral flexion 42 min on pain R 45  Right rotation WNL pain on L  WNL  Left rotation WNL pain on L  WNL   (Blank rows = not tested)  UPPER EXTREMITY ROM:  Active ROM Right eval Left eval  Shoulder flexion  170  Shoulder extension    Shoulder abduction  170  Shoulder adduction    Shoulder extension    Shoulder internal rotation  *Full FR   Shoulder external rotation  *Full FR  Elbow flexion    Elbow extension    Wrist flexion    Wrist extension    Wrist ulnar deviation    Wrist radial deviation    Wrist pronation    Wrist supination     (Blank rows = not tested)  *Fingertips meet to touch midback   UPPER EXTREMITY MMT:  MMT Right eval Left eval Lt.  07/25/23  Shoulder flexion 5 4+ 5  Shoulder extension     Shoulder abduction 5 4+ 5  Shoulder adduction      Shoulder extension 5 4 5   Shoulder internal rotation     Shoulder external rotation     Middle trapezius 4+ 4 5  Lower trapezius     Elbow flexion 5 5 5   Elbow extension 5 4 5   Wrist flexion     Wrist extension     Wrist ulnar deviation     Wrist radial deviation     Wrist pronation     Wrist supination     Grip strength 75 86    (Blank rows = not tested)  CERVICAL SPECIAL TESTS:  Upper limb tension test (ULTT): neg but with pain, Spurling's test: Positive, and Distraction test: no change in pain but did feel good NEG SPURLING: pain on L side of neck with Rt spurling, none in arm   FUNCTIONAL TESTS:  DNF-: 30 sec with mod difficulty  30 sec on DC TODAY'S TREATMENT:    [facility] Adult PT Treatment:                                                DATE: 07/16/23  Therapeutic Activity:  goal check, MMT, ROM  Manual Therapy: Trigger Point Dry Needling  Subsequent Treatment: Instructions provided previously at initial dry needling treatment.   Patient Verbal Consent Given: Yes Education Handout Provided: Yes Muscles Treated: Upper trap Electrical Stimulation Performed: No Treatment Response/Outcome: twitch response, tissue lengthened   Self Care: Plan of care, discharge and goals Posture with lifting review    [facility] Adult PT Treatment:  DATE: 06/13/23 Therapeutic Exercise: UBE level 2 x3'/3' fwd/bwd while gathering subjective Triceps push up on wall 2x10 Bus drivers GTB 2N56" Manual therapy (performed by certified therapist Anne Ng, DPT): Skilled palpation to identify trigger points for TDN STM to all listed muscles following TDN  Lateral glide mid and upper cervical spine  Trigger Point Dry-Needling  Treatment instructions: Expect mild to moderate muscle soreness. S/S of pneumothorax if dry needled over a lung field, and to seek immediate medical attention should they occur. Patient verbalized understanding of these  instructions and education.  Patient Consent Given: Yes Education handout provided: Yes Muscles treated: L UT Electrical stimulation performed: No Parameters: N/A Treatment response/outcome: twitch   [facility] Adult PT Treatment:                                                DATE: 06/11/23 Therapeutic Exercise: UBE in reverse level 3 -5 min  Forward flexion blue band double arm full ROM then pulse Bent elbow ER, flexion (side/facing) blue band full ROM then ER pulse  Opposite facing for IR  Reformer overhead press 1 red 1 yellow double arm  x 15 then 1 red for single arm  x 15  Shrug and plug scapular depression  Swan x 10  1 red Reformer pulling straps 1 red x 10, triceps x 10 Seated single arm pull with rotation 1 red x 10 each    Self Care: scapular stability, pain in elbow (causes) working opposing muscle group and structuring workouts  [facility] Adult PT Treatment:                                                DATE: 06/04/23 Therapeutic Exercise: Supine chin tuck Single arm horizontal abd 8 lbs for stability  Double arm narrow press 10 lbs  x 12 Skull crusher 10 lbs double arm x 15  Sidelying reverse fly 6 lbs x 45 sec  Sidelying scaption 6 lbs  x 45 sec  Sideplank 30 sec each side  Horizontal abduction blue band Narrow grip overhead press Triceps push up on wall double arm , single arm  Trigger Point Dry Needling  Subsequent Treatment: Instructions provided previously at initial dry needling treatment.   Patient Verbal Consent Given: Yes Education Handout Provided: No   Muscles Treated: L UT Electrical Stimulation Performed: No Treatment Response/Outcome: multiple twitch response      PATIENT EDUCATION:  Education details: PT, HEP, deep neck flexor, upper crossed syndrome, trigger points  Person educated: Patient Education method: Programmer, multimedia, Demonstration, Verbal cues, and Handouts Education comprehension: needs further education  HOME EXERCISE  PROGRAM:   Access Code: [email or web address] URL: [email or web address] Date: 05/09/2023 Prepared by: Karie Mainland Access Code: [email or web address] URL: [email or web address] Date: 06/04/2023 Prepared by: Karie Mainland  Exercises - Supine Head Nod with Deep Neck Flexor Activation  - 1-2 x daily - 7 x weekly - 2 sets - 10 reps - 10 hold - Cervical Retraction with Resistance  - 1-2 x daily - 7 x weekly - 2 sets - 10 reps - 5 hold - Standing Shoulder Horizontal Abduction with Resistance  - 1 x daily - 7 x weekly - 2 sets - 10 reps - 5 hold - Wall Angels  -  1 x daily - 7 x weekly - 2 sets - 10 reps - 5 hold - Prone Scapular Retraction Y  - 1 x daily - 7 x weekly - 2 sets - 10 reps - 5 hold   CLINICAL IMPRESSION:  Patient presents to PT reporting no current pain and that he has been going to the gym more frequently with no issues. TPDN performed this session by certified therapist Alice Rieger PT.  Patient reporting decreased tension after treatment, with referral of pain up to his ear.   Patient has been able to progress independently in the past few weeks and feel he can manage his symptoms and postural awareness.     OBJECTIVE IMPAIRMENTS: decreased strength, increased fascial restrictions, postural dysfunction, and pain.   ACTIVITY LIMITATIONS: carrying and lifting  PARTICIPATION LIMITATIONS: community activity and school  PERSONAL FACTORS: Age, Time since onset of injury/illness/exacerbation, and 1 comorbidity: hypermobile spine   are also affecting patient's functional outcome.   REHAB POTENTIAL: Excellent  CLINICAL DECISION MAKING: Evolving  EVALUATION COMPLEXITY: MOD   GOALS: Goals reviewed with patient? Yes  LONG TERM GOALS: Target date: 06/20/2023    Pt will be I with HEP for cervical and scapular stability, Shoulder strength  Baseline:  Goal status: MET   2.  Pt will be able to demo pain free AROM of cervical spine  Baseline:  Goal status: MET   3.   Pt will perform DNF test to 30 sec with no loss of capital flexion  Baseline: 30 sec with mod difficulty, chin lifts slightly  Goal status: MET   4.  Pt will report resolution of L sided shoulder symptoms  Baseline: tension in L upper trap, min in bicep Goal status: MET    5.  Pt will demo 5/5 triceps strength for optimal shoulder function.  Baseline:  Goal status: MEt  6.  FOTO score will improve to 70%  Baseline: 54%, 73% Goal status: MET    PLAN:  PT FREQUENCY: 2x/week  PT DURATION: 6 weeks  PLANNED INTERVENTIONS: 97164- PT Re-evaluation, 97110-Therapeutic exercises, 97530- Therapeutic activity, 97112- Neuromuscular re-education, 97535- Self Care, 62952- Manual therapy, 97014- Electrical stimulation (unattended), [id number]- Electrical stimulation (manual), Patient/Family education, Taping, Dry Needling, Spinal mobilization, Cryotherapy, and Moist heat  PLAN FOR NEXT SESSION:  Loyal Buba PT 07/25/23 4:16 PM Phone: [phone] Fax: [phone]   Alphonzo Severance PT, DPT 07/27/23 10:00 AM

## 2023-08-11 DIAGNOSIS — Z419 Encounter for procedure for purposes other than remedying health state, unspecified: Secondary | ICD-10-CM | POA: Diagnosis not present

## 2023-08-31 ENCOUNTER — Ambulatory Visit: Payer: Self-pay | Admitting: Internal Medicine

## 2023-09-06 ENCOUNTER — Encounter: Payer: Self-pay | Admitting: Internal Medicine

## 2023-09-08 DIAGNOSIS — Z419 Encounter for procedure for purposes other than remedying health state, unspecified: Secondary | ICD-10-CM | POA: Diagnosis not present

## 2023-10-20 DIAGNOSIS — Z419 Encounter for procedure for purposes other than remedying health state, unspecified: Secondary | ICD-10-CM | POA: Diagnosis not present

## 2023-11-19 DIAGNOSIS — Z419 Encounter for procedure for purposes other than remedying health state, unspecified: Secondary | ICD-10-CM | POA: Diagnosis not present

## 2023-12-20 DIAGNOSIS — Z419 Encounter for procedure for purposes other than remedying health state, unspecified: Secondary | ICD-10-CM | POA: Diagnosis not present

## 2024-01-19 DIAGNOSIS — Z419 Encounter for procedure for purposes other than remedying health state, unspecified: Secondary | ICD-10-CM | POA: Diagnosis not present

## 2024-02-07 ENCOUNTER — Encounter: Payer: Self-pay | Admitting: Emergency Medicine

## 2024-02-07 ENCOUNTER — Other Ambulatory Visit: Payer: Self-pay

## 2024-02-07 ENCOUNTER — Ambulatory Visit
Admission: EM | Admit: 2024-02-07 | Discharge: 2024-02-07 | Disposition: A | Discharge status: Home / Self Care | Attending: Family Medicine | Admitting: Family Medicine

## 2024-02-07 DIAGNOSIS — S91312A Laceration without foreign body, left foot, initial encounter: Secondary | ICD-10-CM

## 2024-02-07 DIAGNOSIS — S91332A Puncture wound without foreign body, left foot, initial encounter: Secondary | ICD-10-CM | POA: Diagnosis not present

## 2024-02-07 DIAGNOSIS — Z23 Encounter for immunization: Secondary | ICD-10-CM | POA: Diagnosis not present

## 2024-02-07 MED ORDER — BACITRACIN 500 UNIT/GM EX OINT
1.0000 | TOPICAL_OINTMENT | Freq: Once | CUTANEOUS | Status: AC
  Administered 2024-02-07: 1 via TOPICAL

## 2024-02-07 MED ORDER — CHLORHEXIDINE GLUCONATE 4 % EX SOLN: Freq: Every day | CUTANEOUS | 0 refills | Status: AC | PRN

## 2024-02-07 MED ORDER — CEPHALEXIN 500 MG PO CAPS: 500.0000 mg | ORAL_CAPSULE | Freq: Two times a day (BID) | ORAL | 0 refills | Status: AC

## 2024-02-07 MED ORDER — MUPIROCIN 2 % EX OINT: 1.0000 | TOPICAL_OINTMENT | Freq: Two times a day (BID) | CUTANEOUS | 0 refills | Status: AC

## 2024-02-07 NOTE — Discharge Instructions (Addendum)
 Clean the area at least once if not twice a day with the Hibiclens  solution, apply the mupirocin  ointment and a nonstick dressing.  Do this until it is fully healed.  Take the course of antibiotics to prevent infection.  Elevate at rest to help with swelling, avoid exertional activity until feeling better.  We do not have any vaccines on record for you in order to print out your immunization record as requested, you will have to speak with your primary care provider to obtain this information.

## 2024-02-07 NOTE — ED Triage Notes (Signed)
 Pt reports stepped on a nail with left foot on back deck this am. No bleeding noted at this time. Skin noted to be approximated from initial impact. Last tetanus 2023.

## 2024-02-07 NOTE — ED Notes (Signed)
 Site cleaned with Hibiclense and sterile water.   Bacitracin  ointment applied, nonadherent pad placed,secured with coban. Pt tolerated well.  Site management and infection prevention education reviewed. Pt verbalized understanding.

## 2024-02-07 NOTE — ED Provider Notes (Signed)
 [facility]    CSN: [id number] Arrival date & time: 02/07/24  0827      History   Chief Complaint Chief Complaint  Patient presents with   Laceration    HPI Daniel Wilcox is a 18 y.o. male.   Patient presenting today with a puncture wound and flap laceration to the left plantar aspect of the foot.  States he stepped on a nail on the deck this morning.  States entire nail came back out and he cleaned the area at home and applied a cotton dressing.  Denies decreased range of motion, numbness, tingling, uncontrolled bleeding, concern for foreign body retention.  States his last tetanus shot was in 2023.    History reviewed. No pertinent past medical history.  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 Authorizing Provider  cephALEXin  (KEFLEX ) 500 MG capsule Take 1 capsule (500 mg total) by mouth 2 (two) times daily. 02/07/24  Yes Stuart Vernell Norris, PA-C  chlorhexidine  (HIBICLENS ) 4 % external liquid Apply topically daily as needed. 02/07/24  Yes Stuart Vernell Norris, PA-C  mupirocin  ointment (BACTROBAN ) 2 % Apply 1 Application topically 2 (two) times daily. 02/07/24  Yes Stuart Vernell Norris, PA-C  meloxicam  (MOBIC ) 15 MG tablet Take 1 tablet (15 mg total) by mouth daily. Patient not taking: Reported on 05/09/2023 03/06/23   Arvell Evalene SAUNDERS, DO  Multiple Vitamin (MULTIVITAMIN WITH MINERALS) TABS tablet Take 1 tablet by mouth daily.    [provider]    Family History Family History  Problem Relation Age of Onset   Hypertension Mother     Hyperlipidemia Father     Social History Social History   Tobacco Use   Smoking status: Never   Smokeless tobacco: Never  Substance Use Topics   Alcohol use: Never   Drug use: Never     Allergies   Patient has no allergy information on record.   Review of Systems Review of Systems Per HPI  Physical Exam Triage Vital Signs ED Triage Vitals  Encounter Vitals Group     BP 02/07/24 0923 (!) 97/54     Girls Systolic BP Percentile --      Girls Diastolic BP Percentile --      Boys Systolic BP Percentile --      Boys Diastolic BP Percentile --      Pulse Rate 02/07/24 0923 (!) 40     Resp 02/07/24 0923 20     Temp 02/07/24 0923 98.4 F (36.9 C)     Temp Source 02/07/24 0923 Oral     SpO2 02/07/24 0923 98 %     Weight --      Height --      Head Circumference --      Peak Flow --      Pain Score 02/07/24 0921 0     Pain Loc --      Pain Education --      Exclude from Growth Chart --    No data found.  Updated Vital Signs BP (!) 97/54 (BP  Location: Right Arm)   Pulse (!) 40   Temp 98.4 F (36.9 C) (Oral)   Resp 20   SpO2 98%   Visual Acuity Right Eye Distance:   Left Eye Distance:   Bilateral Distance:    Right Eye Near:   Left Eye Near:    Bilateral Near:     Physical Exam Vitals and nursing note reviewed.  Constitutional:      Appearance: Normal appearance.  HENT:     Head: Atraumatic.  Eyes:     Extraocular Movements: Extraocular movements intact.     Conjunctiva/sclera: Conjunctivae normal.  Cardiovascular:     Rate and Rhythm: Normal rate.  Pulmonary:     Effort: Pulmonary effort is normal.  Musculoskeletal:        General: Tenderness and signs of injury present. Normal range of motion.     Cervical back: Normal range of motion and neck supple.  Skin:    General: Skin is warm and dry.     Comments: Puncture and V-shaped superficial flap laceration to the plantar aspect of the left foot just below the middle toes, already adhered back  flush.  No appreciable foreign bodies, active bleeding or drainage  Neurological:     Mental Status: He is oriented to person, place, and time.     Motor: No weakness.     Gait: Gait normal.     Comments: Left lower extremity neurovascularly intact  Psychiatric:        Mood and Affect: Mood normal.        Thought Content: Thought content normal.        Judgment: Judgment normal.      UC Treatments / Results  Labs (all labs ordered are listed, but only abnormal results are displayed) Labs Reviewed - No data to display  EKG   Radiology No results found.  Procedures Procedures (including critical care time)  Medications Ordered in UC Medications  bacitracin  ointment 1 Application (1 Application Topical Given 02/07/24 1002)    Initial Impression / Assessment and Plan / UC Course  I have reviewed the triage vital signs and the nursing notes.  Pertinent labs & imaging results that were available during my care of the patient were reviewed by me and considered in my medical decision making (see chart for details).     No indication for wound closure today, area is superficial and already adhered back flush.  Wound cleaned thoroughly, dressed with bacitracin  and nonstick dressing.  Tetanus up-to-date per patient.  Treat with Keflex , Hibiclens , mupirocin .  Return precautions reviewed.  Final Clinical Impressions(s) / UC Diagnoses   Final diagnoses:  Foot laceration, left, initial encounter  Puncture wound of left foot, initial encounter     Discharge Instructions      Clean the area at least once if not twice a day with the Hibiclens  solution, apply the mupirocin  ointment and a nonstick dressing.  Do this until it is fully healed.  Take the course of antibiotics to prevent infection.  Elevate at rest to help with swelling, avoid exertional activity until feeling better.  We do not have any vaccines on record for you in order to print out your immunization record as  requested, you will have to speak with your primary care provider to obtain this information.    ED Prescriptions     Medication Sig Dispense Auth. Provider   cephALEXin  (KEFLEX ) 500 MG capsule Take 1 capsule (500 mg total) by mouth 2 (two) times daily.  14 capsule Stuart Vernell Norris, PA-C   mupirocin  ointment (BACTROBAN ) 2 % Apply 1 Application topically 2 (two) times daily. 60 g Stuart Vernell Norris, PA-C   chlorhexidine  (HIBICLENS ) 4 % external liquid Apply topically daily as needed. 236 mL Stuart Vernell Norris, [place]      PDMP not reviewed this encounter.   Stuart Vernell Norris, [place] 02/07/24 1004

## 2024-02-19 DIAGNOSIS — Z419 Encounter for procedure for purposes other than remedying health state, unspecified: Secondary | ICD-10-CM | POA: Diagnosis not present

## 2024-08-06 ENCOUNTER — Encounter: Payer: Self-pay | Admitting: Internal Medicine
